# Patient Record
Sex: Female | Born: 1965 | Race: White | Hispanic: No | Marital: Married | Smoking: Never smoker
Health system: Southern US, Community
[De-identification: ages and names within clinical notes are randomized; demographics above are authoritative.]

## PROBLEM LIST (undated history)

## (undated) DIAGNOSIS — N009 Acute nephritic syndrome with unspecified morphologic changes: Secondary | ICD-10-CM

## (undated) DIAGNOSIS — K2970 Gastritis, unspecified, without bleeding: Secondary | ICD-10-CM

## (undated) DIAGNOSIS — K5900 Constipation, unspecified: Secondary | ICD-10-CM

## (undated) DIAGNOSIS — J382 Nodules of vocal cords: Secondary | ICD-10-CM

## (undated) DIAGNOSIS — C884 Extranodal marginal zone B-cell lymphoma of mucosa-associated lymphoid tissue [MALT-lymphoma]: Secondary | ICD-10-CM

## (undated) DIAGNOSIS — F419 Anxiety disorder, unspecified: Secondary | ICD-10-CM

## (undated) DIAGNOSIS — K219 Gastro-esophageal reflux disease without esophagitis: Secondary | ICD-10-CM

## (undated) DIAGNOSIS — R6882 Decreased libido: Secondary | ICD-10-CM

## (undated) DIAGNOSIS — M3500 Sicca syndrome, unspecified: Secondary | ICD-10-CM

## (undated) DIAGNOSIS — Z5189 Encounter for other specified aftercare: Secondary | ICD-10-CM

## (undated) DIAGNOSIS — T7840XA Allergy, unspecified, initial encounter: Secondary | ICD-10-CM

## (undated) DIAGNOSIS — E559 Vitamin D deficiency, unspecified: Secondary | ICD-10-CM

## (undated) DIAGNOSIS — F489 Nonpsychotic mental disorder, unspecified: Secondary | ICD-10-CM

## (undated) HISTORY — PX: RENAL BIOPSY: SHX156

## (undated) HISTORY — DX: Extranodal marginal zone B-cell lymphoma of mucosa-associated lymphoid tissue (MALT-lymphoma): C88.4

## (undated) HISTORY — DX: Decreased libido: R68.82

## (undated) HISTORY — DX: Anxiety disorder, unspecified: F41.9

## (undated) HISTORY — DX: Gastro-esophageal reflux disease without esophagitis: K21.9

## (undated) HISTORY — DX: Nonpsychotic mental disorder, unspecified: F48.9

## (undated) HISTORY — DX: Encounter for other specified aftercare: Z51.89

## (undated) HISTORY — PX: ORIF METATARSAL FRACTURE: SUR942

## (undated) HISTORY — PX: BREAST CYST ASPIRATION: SHX578

## (undated) HISTORY — DX: Sjogren syndrome, unspecified: M35.00

## (undated) HISTORY — PX: TONSILLECTOMY: SUR1361

## (undated) HISTORY — DX: Allergy, unspecified, initial encounter: T78.40XA

## (undated) HISTORY — PX: PAROTIDECTOMY: SUR1003

## (undated) HISTORY — DX: Nodules of vocal cords: J38.2

## (undated) HISTORY — DX: Vitamin D deficiency, unspecified: E55.9

## (undated) HISTORY — PX: LUMBAR FUSION: SHX111

## (undated) HISTORY — DX: Gastritis, unspecified, without bleeding: K29.70

## (undated) HISTORY — DX: Constipation, unspecified: K59.00

## (undated) HISTORY — DX: Acute nephritic syndrome with unspecified morphologic changes: N00.9

---

## 2017-03-20 DIAGNOSIS — U071 COVID-19: Secondary | ICD-10-CM

## 2017-03-20 HISTORY — DX: COVID-19: U07.1

## 2019-11-20 DIAGNOSIS — M3500 Sicca syndrome, unspecified: Secondary | ICD-10-CM | POA: Diagnosis not present

## 2019-11-20 DIAGNOSIS — G4700 Insomnia, unspecified: Secondary | ICD-10-CM | POA: Diagnosis not present

## 2019-11-20 DIAGNOSIS — F411 Generalized anxiety disorder: Secondary | ICD-10-CM | POA: Diagnosis not present

## 2019-11-20 DIAGNOSIS — N059 Unspecified nephritic syndrome with unspecified morphologic changes: Secondary | ICD-10-CM | POA: Diagnosis not present

## 2019-11-21 ENCOUNTER — Other Ambulatory Visit: Payer: Self-pay | Admitting: Family Medicine

## 2019-11-21 DIAGNOSIS — N6322 Unspecified lump in the left breast, upper inner quadrant: Secondary | ICD-10-CM

## 2019-12-25 DIAGNOSIS — G4700 Insomnia, unspecified: Secondary | ICD-10-CM | POA: Diagnosis not present

## 2019-12-25 DIAGNOSIS — B9689 Other specified bacterial agents as the cause of diseases classified elsewhere: Secondary | ICD-10-CM | POA: Diagnosis not present

## 2019-12-25 DIAGNOSIS — H109 Unspecified conjunctivitis: Secondary | ICD-10-CM | POA: Diagnosis not present

## 2019-12-26 DIAGNOSIS — R922 Inconclusive mammogram: Secondary | ICD-10-CM | POA: Diagnosis not present

## 2019-12-26 DIAGNOSIS — N6489 Other specified disorders of breast: Secondary | ICD-10-CM | POA: Diagnosis not present

## 2020-01-21 DIAGNOSIS — R319 Hematuria, unspecified: Secondary | ICD-10-CM | POA: Diagnosis not present

## 2020-01-21 DIAGNOSIS — Z79890 Hormone replacement therapy (postmenopausal): Secondary | ICD-10-CM | POA: Diagnosis not present

## 2020-01-21 DIAGNOSIS — Z23 Encounter for immunization: Secondary | ICD-10-CM | POA: Diagnosis not present

## 2020-01-21 DIAGNOSIS — E781 Pure hyperglyceridemia: Secondary | ICD-10-CM | POA: Diagnosis not present

## 2020-01-21 DIAGNOSIS — N059 Unspecified nephritic syndrome with unspecified morphologic changes: Secondary | ICD-10-CM | POA: Diagnosis not present

## 2020-01-21 DIAGNOSIS — M3500 Sicca syndrome, unspecified: Secondary | ICD-10-CM | POA: Diagnosis not present

## 2020-02-02 DIAGNOSIS — C8510 Unspecified B-cell lymphoma, unspecified site: Secondary | ICD-10-CM | POA: Diagnosis not present

## 2020-02-02 DIAGNOSIS — M3500 Sicca syndrome, unspecified: Secondary | ICD-10-CM | POA: Diagnosis not present

## 2020-02-02 DIAGNOSIS — R7309 Other abnormal glucose: Secondary | ICD-10-CM | POA: Diagnosis not present

## 2020-02-02 DIAGNOSIS — N059 Unspecified nephritic syndrome with unspecified morphologic changes: Secondary | ICD-10-CM | POA: Diagnosis not present

## 2020-02-02 DIAGNOSIS — R809 Proteinuria, unspecified: Secondary | ICD-10-CM | POA: Diagnosis not present

## 2020-02-03 DIAGNOSIS — S61031A Puncture wound without foreign body of right thumb without damage to nail, initial encounter: Secondary | ICD-10-CM | POA: Diagnosis not present

## 2020-02-08 DIAGNOSIS — L03011 Cellulitis of right finger: Secondary | ICD-10-CM | POA: Diagnosis not present

## 2020-02-10 DIAGNOSIS — L0390 Cellulitis, unspecified: Secondary | ICD-10-CM | POA: Diagnosis not present

## 2020-02-17 DIAGNOSIS — M3500 Sicca syndrome, unspecified: Secondary | ICD-10-CM | POA: Diagnosis not present

## 2020-02-17 DIAGNOSIS — N059 Unspecified nephritic syndrome with unspecified morphologic changes: Secondary | ICD-10-CM | POA: Diagnosis not present

## 2020-02-18 DIAGNOSIS — R809 Proteinuria, unspecified: Secondary | ICD-10-CM | POA: Diagnosis not present

## 2020-02-18 DIAGNOSIS — N1831 Chronic kidney disease, stage 3a: Secondary | ICD-10-CM | POA: Diagnosis not present

## 2020-02-19 DIAGNOSIS — L03011 Cellulitis of right finger: Secondary | ICD-10-CM | POA: Diagnosis not present

## 2020-03-02 DIAGNOSIS — Z981 Arthrodesis status: Secondary | ICD-10-CM | POA: Diagnosis not present

## 2020-03-02 DIAGNOSIS — M47816 Spondylosis without myelopathy or radiculopathy, lumbar region: Secondary | ICD-10-CM | POA: Diagnosis not present

## 2020-03-02 DIAGNOSIS — R937 Abnormal findings on diagnostic imaging of other parts of musculoskeletal system: Secondary | ICD-10-CM | POA: Diagnosis not present

## 2020-03-02 DIAGNOSIS — M48061 Spinal stenosis, lumbar region without neurogenic claudication: Secondary | ICD-10-CM | POA: Diagnosis not present

## 2020-03-09 DIAGNOSIS — M3500 Sicca syndrome, unspecified: Secondary | ICD-10-CM | POA: Diagnosis not present

## 2020-03-16 DIAGNOSIS — Z20828 Contact with and (suspected) exposure to other viral communicable diseases: Secondary | ICD-10-CM | POA: Diagnosis not present

## 2020-03-18 DIAGNOSIS — Z20822 Contact with and (suspected) exposure to covid-19: Secondary | ICD-10-CM | POA: Diagnosis not present

## 2020-03-23 DIAGNOSIS — F439 Reaction to severe stress, unspecified: Secondary | ICD-10-CM | POA: Diagnosis not present

## 2020-03-23 DIAGNOSIS — F419 Anxiety disorder, unspecified: Secondary | ICD-10-CM | POA: Diagnosis not present

## 2020-03-23 DIAGNOSIS — G44209 Tension-type headache, unspecified, not intractable: Secondary | ICD-10-CM | POA: Diagnosis not present

## 2020-03-24 DIAGNOSIS — F4322 Adjustment disorder with anxiety: Secondary | ICD-10-CM | POA: Diagnosis not present

## 2020-03-26 DIAGNOSIS — Z1152 Encounter for screening for COVID-19: Secondary | ICD-10-CM | POA: Diagnosis not present

## 2020-04-08 DIAGNOSIS — R809 Proteinuria, unspecified: Secondary | ICD-10-CM | POA: Diagnosis not present

## 2020-04-13 DIAGNOSIS — F4322 Adjustment disorder with anxiety: Secondary | ICD-10-CM | POA: Diagnosis not present

## 2020-04-16 DIAGNOSIS — R809 Proteinuria, unspecified: Secondary | ICD-10-CM | POA: Diagnosis not present

## 2020-04-16 DIAGNOSIS — D472 Monoclonal gammopathy: Secondary | ICD-10-CM | POA: Diagnosis not present

## 2020-04-16 DIAGNOSIS — M3500 Sicca syndrome, unspecified: Secondary | ICD-10-CM | POA: Diagnosis not present

## 2020-04-16 DIAGNOSIS — N055 Unspecified nephritic syndrome with diffuse mesangiocapillary glomerulonephritis: Secondary | ICD-10-CM | POA: Diagnosis not present

## 2020-04-22 DIAGNOSIS — F4322 Adjustment disorder with anxiety: Secondary | ICD-10-CM | POA: Diagnosis not present

## 2020-05-04 DIAGNOSIS — F4322 Adjustment disorder with anxiety: Secondary | ICD-10-CM | POA: Diagnosis not present

## 2020-05-10 DIAGNOSIS — F4322 Adjustment disorder with anxiety: Secondary | ICD-10-CM | POA: Diagnosis not present

## 2020-05-18 DIAGNOSIS — R768 Other specified abnormal immunological findings in serum: Secondary | ICD-10-CM | POA: Diagnosis not present

## 2020-05-18 DIAGNOSIS — N059 Unspecified nephritic syndrome with unspecified morphologic changes: Secondary | ICD-10-CM | POA: Diagnosis not present

## 2020-05-18 DIAGNOSIS — M3500 Sicca syndrome, unspecified: Secondary | ICD-10-CM | POA: Diagnosis not present

## 2020-05-18 DIAGNOSIS — M2550 Pain in unspecified joint: Secondary | ICD-10-CM | POA: Diagnosis not present

## 2020-05-20 DIAGNOSIS — M9901 Segmental and somatic dysfunction of cervical region: Secondary | ICD-10-CM | POA: Diagnosis not present

## 2020-05-20 DIAGNOSIS — M9903 Segmental and somatic dysfunction of lumbar region: Secondary | ICD-10-CM | POA: Diagnosis not present

## 2020-05-20 DIAGNOSIS — M9902 Segmental and somatic dysfunction of thoracic region: Secondary | ICD-10-CM | POA: Diagnosis not present

## 2020-05-20 DIAGNOSIS — M9905 Segmental and somatic dysfunction of pelvic region: Secondary | ICD-10-CM | POA: Diagnosis not present

## 2020-05-28 DIAGNOSIS — M9901 Segmental and somatic dysfunction of cervical region: Secondary | ICD-10-CM | POA: Diagnosis not present

## 2020-05-28 DIAGNOSIS — M9902 Segmental and somatic dysfunction of thoracic region: Secondary | ICD-10-CM | POA: Diagnosis not present

## 2020-05-28 DIAGNOSIS — M9905 Segmental and somatic dysfunction of pelvic region: Secondary | ICD-10-CM | POA: Diagnosis not present

## 2020-05-28 DIAGNOSIS — M9903 Segmental and somatic dysfunction of lumbar region: Secondary | ICD-10-CM | POA: Diagnosis not present

## 2020-06-18 DIAGNOSIS — M9901 Segmental and somatic dysfunction of cervical region: Secondary | ICD-10-CM | POA: Diagnosis not present

## 2020-06-18 DIAGNOSIS — M9903 Segmental and somatic dysfunction of lumbar region: Secondary | ICD-10-CM | POA: Diagnosis not present

## 2020-06-18 DIAGNOSIS — M9902 Segmental and somatic dysfunction of thoracic region: Secondary | ICD-10-CM | POA: Diagnosis not present

## 2020-06-18 DIAGNOSIS — M9905 Segmental and somatic dysfunction of pelvic region: Secondary | ICD-10-CM | POA: Diagnosis not present

## 2020-06-25 DIAGNOSIS — M9903 Segmental and somatic dysfunction of lumbar region: Secondary | ICD-10-CM | POA: Diagnosis not present

## 2020-06-25 DIAGNOSIS — M9901 Segmental and somatic dysfunction of cervical region: Secondary | ICD-10-CM | POA: Diagnosis not present

## 2020-06-25 DIAGNOSIS — M9905 Segmental and somatic dysfunction of pelvic region: Secondary | ICD-10-CM | POA: Diagnosis not present

## 2020-06-25 DIAGNOSIS — M9902 Segmental and somatic dysfunction of thoracic region: Secondary | ICD-10-CM | POA: Diagnosis not present

## 2020-07-19 DIAGNOSIS — Z79890 Hormone replacement therapy (postmenopausal): Secondary | ICD-10-CM | POA: Diagnosis not present

## 2020-07-21 DIAGNOSIS — R809 Proteinuria, unspecified: Secondary | ICD-10-CM | POA: Diagnosis not present

## 2020-07-26 DIAGNOSIS — N055 Unspecified nephritic syndrome with diffuse mesangiocapillary glomerulonephritis: Secondary | ICD-10-CM | POA: Diagnosis not present

## 2020-07-26 DIAGNOSIS — D472 Monoclonal gammopathy: Secondary | ICD-10-CM | POA: Diagnosis not present

## 2020-07-26 DIAGNOSIS — R809 Proteinuria, unspecified: Secondary | ICD-10-CM | POA: Diagnosis not present

## 2020-07-26 DIAGNOSIS — D84821 Immunodeficiency due to drugs: Secondary | ICD-10-CM | POA: Diagnosis not present

## 2020-08-02 ENCOUNTER — Telehealth: Payer: Self-pay | Admitting: Adult Health

## 2020-08-02 DIAGNOSIS — N059 Unspecified nephritic syndrome with unspecified morphologic changes: Secondary | ICD-10-CM | POA: Diagnosis not present

## 2020-08-02 DIAGNOSIS — M3500 Sicca syndrome, unspecified: Secondary | ICD-10-CM | POA: Diagnosis not present

## 2020-08-02 DIAGNOSIS — M2550 Pain in unspecified joint: Secondary | ICD-10-CM | POA: Diagnosis not present

## 2020-08-02 DIAGNOSIS — R768 Other specified abnormal immunological findings in serum: Secondary | ICD-10-CM | POA: Diagnosis not present

## 2020-08-02 NOTE — Telephone Encounter (Signed)
I called patient to discuss Evusheld, a long acting monoclonal antibody injection administered to patients with decreased immune systems or intolerance/allergy to the COVID 19 vaccine as COVID19 prevention.    Patient is going to f/u with Dr. Amil Amen and her insurance and will call me back if she decides to proceed with the injection.  Call back # given.   Wilber Bihari, NP

## 2020-08-10 DIAGNOSIS — D225 Melanocytic nevi of trunk: Secondary | ICD-10-CM | POA: Diagnosis not present

## 2020-08-10 DIAGNOSIS — D224 Melanocytic nevi of scalp and neck: Secondary | ICD-10-CM | POA: Diagnosis not present

## 2020-08-10 DIAGNOSIS — L821 Other seborrheic keratosis: Secondary | ICD-10-CM | POA: Diagnosis not present

## 2020-08-10 DIAGNOSIS — L814 Other melanin hyperpigmentation: Secondary | ICD-10-CM | POA: Diagnosis not present

## 2020-08-10 DIAGNOSIS — D485 Neoplasm of uncertain behavior of skin: Secondary | ICD-10-CM | POA: Diagnosis not present

## 2020-08-12 ENCOUNTER — Ambulatory Visit (INDEPENDENT_AMBULATORY_CARE_PROVIDER_SITE_OTHER): Payer: BC Managed Care – PPO

## 2020-08-12 ENCOUNTER — Other Ambulatory Visit: Payer: Self-pay

## 2020-08-12 ENCOUNTER — Other Ambulatory Visit: Payer: Self-pay | Admitting: Adult Health

## 2020-08-12 VITALS — BP 129/78 | HR 71 | Temp 97.9°F | Resp 16

## 2020-08-12 DIAGNOSIS — D849 Immunodeficiency, unspecified: Secondary | ICD-10-CM

## 2020-08-12 DIAGNOSIS — Z298 Encounter for other specified prophylactic measures: Secondary | ICD-10-CM

## 2020-08-12 MED ORDER — TIXAGEVIMAB (PART OF EVUSHELD) INJECTION
300.0000 mg | Freq: Once | INTRAMUSCULAR | Status: AC
  Administered 2020-08-12: 300 mg via INTRAMUSCULAR

## 2020-08-12 MED ORDER — ALBUTEROL SULFATE HFA 108 (90 BASE) MCG/ACT IN AERS: 2.0000 | INHALATION_SPRAY | Freq: Once | RESPIRATORY_TRACT | Status: DC | PRN

## 2020-08-12 MED ORDER — EPINEPHRINE 0.3 MG/0.3ML IJ SOAJ: 0.3000 mg | Freq: Once | INTRAMUSCULAR | Status: DC | PRN

## 2020-08-12 MED ORDER — METHYLPREDNISOLONE SODIUM SUCC 125 MG IJ SOLR: 125.0000 mg | Freq: Once | INTRAMUSCULAR | Status: AC | PRN

## 2020-08-12 MED ORDER — DIPHENHYDRAMINE HCL 50 MG/ML IJ SOLN: 50.0000 mg | Freq: Once | INTRAMUSCULAR | Status: DC | PRN

## 2020-08-12 MED ORDER — CILGAVIMAB (PART OF EVUSHELD) INJECTION
300.0000 mg | Freq: Once | INTRAMUSCULAR | Status: AC
Start: 2020-08-12 — End: 2020-08-12
  Administered 2020-08-12: 300 mg via INTRAMUSCULAR
  Filled 2020-08-12: qty 3

## 2020-08-12 NOTE — Progress Notes (Signed)
I connected by phone with Mindy Fuller on 08/12/2020, 11:13 AM to discuss the potential use of a new treatment, tixagevimab/cilgavimab, for pre-exposure prophylaxis for prevention of coronavirus disease 2019 (COVID-19) caused by the SARS-CoV-2 virus.  This patient is a 55 y.o. female that meets the FDA criteria for Emergency Use Authorization of tixagevimab/cilgavimab for pre-exposure prophylaxis of COVID-19 disease. Pt meets following criteria:  Age >12 yr and weight > 40kg  Not currently infected with SARS-CoV-2 and has no known recent exposure to an individual infected with SARS-CoV-2 AND o Who has moderate to severe immune compromise due to a medical condition or receipt of immunosuppressive medications or treatments and may not mount an adequate immune response to COVID-19 vaccination or  o Vaccination with any available COVID-19 vaccine, according to the approved or authorized schedule, is not recommended due to a history of severe adverse reaction (e.g., severe allergic reaction) to a COVID-19 vaccine(s) and/or COVID-19 vaccine component(s).  o Patient meets the following definition of mod-severe immune compromised status: 1. Received B-cell depleting therapies (e.g. rituximab, obinutuzumab, ocrelizumab, alemtuzumab) within last 6 months & age > or = 54  I have spoken and communicated the following to the patient or parent/caregiver regarding COVID monoclonal antibody treatment:  1. FDA has authorized the emergency use of tixagevimab/cilgavimab for the pre-exposure prophylaxis of COVID-19 in patients with moderate-severe immunocompromised status, who meet above EUA criteria.  2. The significant known and potential risks and benefits of COVID monoclonal antibody, and the extent to which such potential risks and benefits are unknown.  3. Information on available alternative treatments and the risks and benefits of those alternatives, including clinical trials.  4. The patient or  parent/caregiver has the option to accept or refuse COVID monoclonal antibody treatment.  After reviewing this information with the patient, agree to receive tixagevimab/cilgavimab.    Scot Dock, NP, 08/12/2020, 11:13 AM

## 2020-08-12 NOTE — Progress Notes (Signed)
Diagnosis: Pre-COVID Exposure Prophylaxis  Provider:  Marshell Garfinkel, MD  Procedure: Injection  Evusheld, Dose: 600mg , Site: intramuscular  Discharge: Condition: Good, Destination: Home . AVS provided to patient.   Performed by:  Paul Dykes, RN

## 2020-08-13 DIAGNOSIS — M9905 Segmental and somatic dysfunction of pelvic region: Secondary | ICD-10-CM | POA: Diagnosis not present

## 2020-08-13 DIAGNOSIS — M9901 Segmental and somatic dysfunction of cervical region: Secondary | ICD-10-CM | POA: Diagnosis not present

## 2020-08-13 DIAGNOSIS — M9902 Segmental and somatic dysfunction of thoracic region: Secondary | ICD-10-CM | POA: Diagnosis not present

## 2020-08-13 DIAGNOSIS — M9903 Segmental and somatic dysfunction of lumbar region: Secondary | ICD-10-CM | POA: Diagnosis not present

## 2020-08-17 DIAGNOSIS — M9901 Segmental and somatic dysfunction of cervical region: Secondary | ICD-10-CM | POA: Diagnosis not present

## 2020-08-17 DIAGNOSIS — M9903 Segmental and somatic dysfunction of lumbar region: Secondary | ICD-10-CM | POA: Diagnosis not present

## 2020-08-17 DIAGNOSIS — M9905 Segmental and somatic dysfunction of pelvic region: Secondary | ICD-10-CM | POA: Diagnosis not present

## 2020-08-17 DIAGNOSIS — M9902 Segmental and somatic dysfunction of thoracic region: Secondary | ICD-10-CM | POA: Diagnosis not present

## 2020-09-22 DIAGNOSIS — M3500 Sicca syndrome, unspecified: Secondary | ICD-10-CM | POA: Diagnosis not present

## 2020-09-28 DIAGNOSIS — M9905 Segmental and somatic dysfunction of pelvic region: Secondary | ICD-10-CM | POA: Diagnosis not present

## 2020-09-28 DIAGNOSIS — M9902 Segmental and somatic dysfunction of thoracic region: Secondary | ICD-10-CM | POA: Diagnosis not present

## 2020-09-28 DIAGNOSIS — M62838 Other muscle spasm: Secondary | ICD-10-CM | POA: Diagnosis not present

## 2020-09-28 DIAGNOSIS — M9903 Segmental and somatic dysfunction of lumbar region: Secondary | ICD-10-CM | POA: Diagnosis not present

## 2020-09-28 DIAGNOSIS — M9901 Segmental and somatic dysfunction of cervical region: Secondary | ICD-10-CM | POA: Diagnosis not present

## 2020-10-13 ENCOUNTER — Encounter: Payer: Self-pay | Admitting: Gastroenterology

## 2020-10-14 ENCOUNTER — Telehealth: Payer: Self-pay | Admitting: Hematology

## 2020-10-14 NOTE — Telephone Encounter (Signed)
Received a new pt referral from Dr. Virgina Jock to establish care with an oncologist. Mindy Fuller has been cld and scheduled to see Dr. Irene Limbo on 8/4 at 11am. Pt aware to arrive 20 minutes early.

## 2020-10-19 ENCOUNTER — Encounter: Payer: Self-pay | Admitting: Internal Medicine

## 2020-10-19 DIAGNOSIS — U071 COVID-19: Secondary | ICD-10-CM | POA: Diagnosis not present

## 2020-10-25 ENCOUNTER — Telehealth: Payer: Self-pay | Admitting: Hematology

## 2020-10-25 NOTE — Telephone Encounter (Signed)
Mindy Fuller has been reschedule to see Dr. Irene Limbo on 8/29 at 11am.

## 2020-10-28 ENCOUNTER — Ambulatory Visit: Payer: BC Managed Care – PPO | Admitting: Hematology

## 2020-11-12 DIAGNOSIS — M9902 Segmental and somatic dysfunction of thoracic region: Secondary | ICD-10-CM | POA: Diagnosis not present

## 2020-11-12 DIAGNOSIS — M9905 Segmental and somatic dysfunction of pelvic region: Secondary | ICD-10-CM | POA: Diagnosis not present

## 2020-11-12 DIAGNOSIS — M9903 Segmental and somatic dysfunction of lumbar region: Secondary | ICD-10-CM | POA: Diagnosis not present

## 2020-11-12 DIAGNOSIS — M9901 Segmental and somatic dysfunction of cervical region: Secondary | ICD-10-CM | POA: Diagnosis not present

## 2020-11-12 DIAGNOSIS — M62838 Other muscle spasm: Secondary | ICD-10-CM | POA: Diagnosis not present

## 2020-11-15 ENCOUNTER — Inpatient Hospital Stay: Payer: BC Managed Care – PPO | Attending: Hematology | Admitting: Hematology

## 2020-11-15 ENCOUNTER — Other Ambulatory Visit: Payer: Self-pay

## 2020-11-15 VITALS — BP 112/65 | HR 75 | Temp 97.9°F | Resp 16 | Ht 66.0 in | Wt 127.3 lb

## 2020-11-15 DIAGNOSIS — N049 Nephrotic syndrome with unspecified morphologic changes: Secondary | ICD-10-CM | POA: Diagnosis not present

## 2020-11-15 DIAGNOSIS — Z881 Allergy status to other antibiotic agents status: Secondary | ICD-10-CM | POA: Insufficient documentation

## 2020-11-15 DIAGNOSIS — Z885 Allergy status to narcotic agent status: Secondary | ICD-10-CM | POA: Insufficient documentation

## 2020-11-15 DIAGNOSIS — Z8572 Personal history of non-Hodgkin lymphomas: Secondary | ICD-10-CM | POA: Insufficient documentation

## 2020-11-15 DIAGNOSIS — Z8249 Family history of ischemic heart disease and other diseases of the circulatory system: Secondary | ICD-10-CM | POA: Insufficient documentation

## 2020-11-15 DIAGNOSIS — Z7952 Long term (current) use of systemic steroids: Secondary | ICD-10-CM | POA: Diagnosis not present

## 2020-11-15 DIAGNOSIS — Z8616 Personal history of COVID-19: Secondary | ICD-10-CM | POA: Insufficient documentation

## 2020-11-15 DIAGNOSIS — Z880 Allergy status to penicillin: Secondary | ICD-10-CM | POA: Diagnosis not present

## 2020-11-15 DIAGNOSIS — M3500 Sicca syndrome, unspecified: Secondary | ICD-10-CM | POA: Diagnosis not present

## 2020-11-15 DIAGNOSIS — Z79899 Other long term (current) drug therapy: Secondary | ICD-10-CM | POA: Diagnosis not present

## 2020-11-15 DIAGNOSIS — Z833 Family history of diabetes mellitus: Secondary | ICD-10-CM | POA: Diagnosis not present

## 2020-11-15 DIAGNOSIS — Z8719 Personal history of other diseases of the digestive system: Secondary | ICD-10-CM | POA: Insufficient documentation

## 2020-11-15 DIAGNOSIS — C8580 Other specified types of non-Hodgkin lymphoma, unspecified site: Secondary | ICD-10-CM

## 2020-11-16 ENCOUNTER — Telehealth: Payer: Self-pay | Admitting: Hematology

## 2020-11-16 ENCOUNTER — Other Ambulatory Visit: Payer: Self-pay

## 2020-11-16 DIAGNOSIS — C8580 Other specified types of non-Hodgkin lymphoma, unspecified site: Secondary | ICD-10-CM

## 2020-11-16 NOTE — Telephone Encounter (Signed)
Attempted to schedule follow-up appointment per 8/29 los. Patient stated she would call back in 6 months to schedule.

## 2020-11-17 DIAGNOSIS — N055 Unspecified nephritic syndrome with diffuse mesangiocapillary glomerulonephritis: Secondary | ICD-10-CM | POA: Diagnosis not present

## 2020-11-22 NOTE — Progress Notes (Signed)
[facility]   HEMATOLOGY/ONCOLOGY CONSULTATION NOTE  Date of Service: .11/15/2020   Patient Care Team: Shon Baton, MD as PCP - General (Internal Medicine)  CHIEF COMPLAINTS/PURPOSE OF CONSULTATION:  History of parotid extranodal marginal zone lymphoma  HISTORY OF PRESENTING ILLNESS:   Mindy Fuller is a wonderful 55 y.o. 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 Patient has a history of Sjogren's syndrome diagnosed per lip biopsy about 14 years ago.  She has had chronic sicca syndrome.  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  This has been treated with Rituxan and she continues to be on maintenance Rituxan every 6 months as per her nephrologist.  Patient notes her extranodal marginal zone lymphoma had presented as a lump in her right parotid gland resulting in right superficial parotidectomy with complete resection of this lesion.  She notes previous imaging have not shown any other recurrence of her low-grade non-Hodgkin's lymphoma. This surgical resection was about 4 to 5 years ago.. We discussed that her maintenance Rituxan from her post COVID glomerulonephritis is likely suppressing her lymphoma with regards to recurrence risk as well.  She notes that she is establishing rheumatology follow-up in [place] as well.  Moved from [place] to [place] in June 2021.  Currently notes she has been feeling well with no fevers no chills no night sweats no unexpected weight loss.  No new lumps or bumps.  Acute new shortness of breath or chest pain.  No pedal edema.  No abdominal pain or distention.     MEDICAL HISTORY:  Past Medical History:  Diagnosis Date   Acute glomerulonephritis    s/p COVID 2019    Anxiety    Constipation    COVID 2019   Extranodal marginal zone B-cell lymphoma (HCC)    Gastritis    GERD (gastroesophageal reflux disease)    Low libido    Sjogren's syndrome (HCC)    Tension    Vitamin D deficiency    Vocal cord nodules   Follows with Dr. Renette Butters for her glomerulonephritis   SURGICAL HISTORY Right superficial parotidectomy for extranodal marginal zone lymphoma Kidney biopsy 03/2019 History of breast biopsy benign cyst aspirated   SOCIAL HISTORY: Social History   Socioeconomic History   Marital status: Married    Spouse name: Not on file   Number of children: Not on file   Years of education: Not on file   Highest education level: Not on file  Occupational History   Not on file  Tobacco Use   Smoking status: Never    Passive exposure: Never   Smokeless tobacco: Never  Vaping Use   Vaping Use: Never used  Substance and Sexual Activity   Alcohol use: Yes    Comment: rare   Drug use: Never   Sexual activity: Not on file  Other Topics Concern   Not on file  Social History Narrative   Not on file   Social Determinants of Health   Financial Resource Strain: Not on file  Food Insecurity: Not on file  Transportation Needs: Not on file  Physical Activity: Not on file  Stress: Not on file  Social Connections: Not on file  Intimate Partner Violence: Not on file  Non-smoker nondrinker. 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   FAMILY HISTORY: Family History  Problem Relation Age of Onset   Hypertension Mother    Coronary artery disease Father    Diabetes Mellitus I Father     ALLERGIES:  is allergic to clarithromycin, amoxicillin, ciprofloxacin, clavulanic acid, and hydrocodone-acetaminophen.  MEDICATIONS:  Current Outpatient Medications  Medication Sig Dispense Refill   acetaminophen (TYLENOL) 500 MG tablet Take 1 tablet by mouth as needed.     ALPRAZolam (XANAX) 0.25 MG tablet Take 0.25 mg by mouth 2 (two) times  daily.     Cholecalciferol (VITAMIN D) 50 MCG (2000 UT) tablet Take 1 tablet by mouth daily.     esomeprazole (NEXIUM) 40 MG capsule Take 20 mg by mouth daily.     Lactobacillus Rhamnosus, GG, (RA PROBIOTIC DIGESTIVE CARE) CAPS Take 1 capsule by mouth daily.     losartan (COZAAR) 25 MG tablet Take 25 mg by mouth daily.     predniSONE (DELTASONE) 5 MG tablet Take 2.5 mg by mouth daily.     progesterone (PROMETRIUM) 100 MG capsule Take 100 mg by mouth daily as needed.     Zinc 50 MG TABS Take 50 mg by mouth daily.     alendronate (FOSAMAX) 70 MG tablet Take 70 mg by mouth once a week. (Patient not taking: Reported on 11/15/2020)     busPIRone (BUSPAR) 10 MG tablet Take 10 mg by mouth every evening.     polyethylene glycol (MIRALAX / GLYCOLAX) 17 g packet Take 17 g by mouth as needed.     riTUXimab (RITUXAN) 100 MG/10ML injection See admin instructions. As directed     traZODone (DESYREL) 100 MG tablet Take 1 tablet by mouth at bedtime.     Current Facility-Administered Medications  Medication Dose Route Frequency Provider Last Rate Last Admin   albuterol (VENTOLIN HFA) 108 (90 Base) MCG/ACT inhaler 2 puff  2 puff Inhalation Once PRN Causey, Charlestine Massed, NP       diphenhydrAMINE (BENADRYL) injection 50 mg  50 mg Intramuscular Once PRN Causey, Charlestine Massed, NP       EPINEPHrine (EPI-PEN) injection 0.3 mg  0.3 mg Intramuscular Once PRN Causey, Charlestine Massed, NP       methylPREDNISolone sodium succinate (SOLU-MEDROL) 125 mg/2 mL injection 125 mg  125 mg Intramuscular Once PRN Causey, Charlestine Massed, NP        REVIEW OF SYSTEMS:    10 Point review of Systems was done is negative except as noted above.  PHYSICAL EXAMINATION: ECOG PERFORMANCE STATUS: 1 - Symptomatic but completely ambulatory  . Vitals:   11/15/20 1113  BP: 112/65  Pulse: 75  Resp: 16  Temp: 97.9 F (36.6 C)  SpO2: 100%   Filed Weights   11/15/20 1113  Weight: 127 lb 4.8 oz (57.7 kg)   .Body mass index  is 20.55 kg/m.  GENERAL:alert, in no acute distress and comfortable SKIN: no acute rashes, no significant lesions EYES: conjunctiva are pink and non-injected, sclera anicteric OROPHARYNX: MMM, no exudates, no oropharyngeal erythema or ulceration NECK: supple, no JVD LYMPH:  no palpable lymphadenopathy in the cervical, axillary or inguinal regions LUNGS: clear to auscultation b/l with normal respiratory effort HEART: regular rate & rhythm ABDOMEN:  normoactive bowel sounds , non tender, not distended. Extremity: no pedal edema PSYCH: alert & oriented x 3 with fluent speech NEURO: no focal motor/sensory deficits  LABORATORY DATA:  I have reviewed the data as listed  .No flowsheet data found.  .No flowsheet data  found.   RADIOGRAPHIC STUDIES: I have personally reviewed the radiological images as listed and agreed with the findings in the report. No results found.  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 Plan  -Patient currently has no clinical symptoms suggestive of recurrence of her extranodal marginal zone lymphoma. -Her parotid extranodal marginal zone lymphoma was likely related to her Sjogren's disease. -We discussed that the Rituxan she is on for post COVID glomerulonephritis treatment with her nephrologist would likely suppress to some degree recurrence of any marginal zone lymphoma at this time.. -We discussed getting labs and she would like to have these with her nephrologist would need to track CBC CMP LDH at least once a year. -Recommended staying up to speed with her COVID-19 vaccinations flu shots and other age-appropriate vaccinations with her primary care physician. -We also discussed that Sjogren's can increase the risk of other secondary cancers and she should follow-up with her primary  care physician for appropriate age-appropriate and family history appropriate cancer screening. -No indication for additional treatment of her extranodal marginal zone lymphoma at this time.   Return to clinic with Dr. Irene Limbo with labs in 12 months    All of the patients questions were answered with apparent satisfaction. The patient knows to call the clinic with any problems, questions or concerns.  I spent 40 minutes counseling the patient face to face. The total time spent in the appointment was 60 minutes and more than 50% was on counseling and direct patient cares reviewing outside records as well as discussing these and confirming these with the patient.    Sullivan Lone MD [place] AAHIVMS [facility] Hematology/Oncology Physician [facility]  (Office):       [phone] (Work cell):  [phone] (Fax):           [phone]

## 2020-11-23 ENCOUNTER — Encounter: Payer: Self-pay | Admitting: Gastroenterology

## 2020-11-23 ENCOUNTER — Ambulatory Visit (INDEPENDENT_AMBULATORY_CARE_PROVIDER_SITE_OTHER): Payer: BC Managed Care – PPO | Admitting: Gastroenterology

## 2020-11-23 VITALS — BP 100/70 | HR 72 | Ht 64.25 in | Wt 127.4 lb

## 2020-11-23 DIAGNOSIS — K219 Gastro-esophageal reflux disease without esophagitis: Secondary | ICD-10-CM

## 2020-11-23 DIAGNOSIS — M350A Sjogren syndrome with glomerular disease: Secondary | ICD-10-CM

## 2020-11-23 DIAGNOSIS — K5900 Constipation, unspecified: Secondary | ICD-10-CM | POA: Diagnosis not present

## 2020-11-23 NOTE — Progress Notes (Signed)
Referring Provider: Shon Baton, MD Primary Care Physician:  Shon Baton, MD   Reason for Consultation:  Reflux   IMPRESSION:  Reflux in the setting of Sjogren's syndrome without interest in prescription medications due to chronic risks given her personal history of osteoporosis. Has historically had an EGD every 3 years to screen for Barrett's Esophagus, although this has never been diagnosed. Does not want to take medications. Briefly discussed TIF.   Sjogren's syndrome: May be contributing to reflux.   Chronic constipation: Continue dietary management.   Colon cancer screening: Reports the last colonoscopy around 2018. Will obtain the procedure report. No personal history of polyps.   PLAN: - Consider Biotene - EGD - TIF brochure - Obtain prior colonoscopy and EGD. [facility] in [place], [place].     HPI: Mindy Fuller is a 55 y.o. female referred by Dr. Virgina Jock. The history is obtained through the patient, review of her electronic health record, and records provided by Dr. Virgina Jock. She has Srojgen's syndrome, Baboo nodule (vocal cord nodule) treated at the [facility], extranodal marginal zone B-cell lymphoma s/p prior parotidectomy 2014, tension headahces, osteoporosis, vitamin D deficiency, she is on chronic steroids for glomerulonephritis occurring after Covid. 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   Identifies first GI symptoms in 1998.  She has a history of GERD, reflux, constipation biliary colic with history of possible SOD (declined evaluation at [facility] at the time) with persistent symptoms despite cholecystectomy.  Presents now with heartburn identified as brash, eructation, and finds she has to repeatedly swallow to keep things down. No evidence for GI bleeding, iron deficiency anemia, anorexia, unexplained weight loss, dysphagia, odynophagia, persistent vomiting, or gastrointestinal cancer in a  first-degree relative.   Does not like to use PPI due to fracture risk although they have controlled her symptoms. She has been afraid to use H2B for fear of gastric cancer.  Antoine Primas provides emergency relief. Does not use Tums or Rolaids because she did not find them helpful. Avoids spicy foods.  She has been having an endoscopy every 2-3 years to look for risks. On a prior endoscopy she was diagnosed with bile acid reflux. She feels that it is time for another endoscopy due to concerns re: Barrett's Esophagus.   She is resistant to taking any medications. She has never considered RIF or fundoplication.   She is 100% organic diet over the last couple of years which has helped with her constipation. Has tried to increase the fiber in her diet at the same time.   One alcohol beverage month. No tobacco, cigarettes, vaping. Drinks 2 cups of half caf daily. No juice or sodas. Avoid spicy and processed foods. Avoids sugar. Dark sugar triggers abdominal pain.   She has chronic constipation with a bowel movement QD-QOD. Sense of complete evacuation or straining. No blood or mucous.  Failed Miralax, Trulance, Amitiza, and Linzess.   Prior colonoscopy and EGD. [facility] in [place], [place].  Last EGD [facility], [facility] The records available to me show: EGD 02/05/19 for reflux and epigastric abdominal pain: chronic inactive gastritis, LA Class A reflux esophagitis, small hiatal hernia, normal duodenal biopsies   Past Medical History:  Diagnosis Date   Acute glomerulonephritis    s/p COVID 2019   Anxiety    Constipation    COVID 2019   Extranodal marginal zone B-cell lymphoma (HCC)    Gastritis    GERD (gastroesophageal  reflux disease)    Low libido    Sjogren's syndrome (HCC)    Tension    Vitamin D deficiency    Vocal cord nodules     Past Surgical History:  Procedure Laterality Date   BREAST CYST ASPIRATION     LUMBAR FUSION     L5-S1   ORIF METATARSAL  FRACTURE Left    PAROTIDECTOMY Right    RENAL BIOPSY     TONSILLECTOMY      Prior to Admission medications   Medication Sig Start Date End Date Taking? Authorizing Provider  acetaminophen (TYLENOL) 500 MG tablet Take 1 tablet by mouth as needed. 04/20/19  Yes [provider]  ALPRAZolam (XANAX) 0.25 MG tablet Take 0.25 mg by mouth as needed. 09/26/19  Yes [provider]  busPIRone (BUSPAR) 10 MG tablet Take 10 mg by mouth every evening. 09/26/19  Yes [provider]  Cholecalciferol (VITAMIN D) 50 MCG (2000 UT) tablet Take 1 tablet by mouth daily.   Yes [provider]  esomeprazole (NEXIUM) 20 MG capsule Take 20 mg by mouth as needed.   Yes [provider]  Lactobacillus Rhamnosus, GG, (RA PROBIOTIC DIGESTIVE CARE) CAPS Take 1 capsule by mouth daily.   Yes [provider]  losartan (COZAAR) 25 MG tablet Take 25 mg by mouth daily. 05/21/19  Yes [provider]  predniSONE (DELTASONE) 5 MG tablet Take 2.5 mg by mouth daily. 07/28/19  Yes [provider]  progesterone (PROMETRIUM) 100 MG capsule Take 100 mg by mouth daily as needed. 10/13/20  Yes [provider]  riTUXimab (RITUXAN) 100 MG/10ML injection See admin instructions. Every 6 months   Yes [provider]  traZODone (DESYREL) 100 MG tablet Take 1 tablet by mouth at bedtime.   Yes [provider]  Zinc 50 MG TABS Take 50 mg by mouth daily.   Yes [provider]    Current Outpatient Medications  Medication Sig Dispense Refill   acetaminophen (TYLENOL) 500 MG tablet Take 1 tablet by mouth as needed.     ALPRAZolam (XANAX) 0.25 MG tablet Take 0.25 mg by mouth as needed.     busPIRone (BUSPAR) 10 MG tablet Take 10 mg by mouth every evening.     Cholecalciferol (VITAMIN D) 50 MCG (2000 UT) tablet Take 1 tablet by mouth daily.     esomeprazole (NEXIUM) 20 MG capsule Take 20 mg by mouth as needed.     Lactobacillus Rhamnosus, GG, (RA  PROBIOTIC DIGESTIVE CARE) CAPS Take 1 capsule by mouth daily.     losartan (COZAAR) 25 MG tablet Take 25 mg by mouth daily.     predniSONE (DELTASONE) 5 MG tablet Take 2.5 mg by mouth daily.     progesterone (PROMETRIUM) 100 MG capsule Take 100 mg by mouth daily as needed.     riTUXimab (RITUXAN) 100 MG/10ML injection See admin instructions. Every 6 months     traZODone (DESYREL) 100 MG tablet Take 1 tablet by mouth at bedtime.     Zinc 50 MG TABS Take 50 mg by mouth daily.     Current Facility-Administered Medications  Medication Dose Route Frequency Provider Last Rate Last Admin   albuterol (VENTOLIN HFA) 108 (90 Base) MCG/ACT inhaler 2 puff  2 puff Inhalation Once PRN Causey, Charlestine Massed, NP       diphenhydrAMINE (BENADRYL) injection 50 mg  50 mg Intramuscular Once PRN Causey, Charlestine Massed, NP       EPINEPHrine (EPI-PEN) injection 0.3 mg  0.3 mg Intramuscular Once PRN Gardenia Phlegm, NP       methylPREDNISolone sodium succinate (SOLU-MEDROL) 125 mg/2 mL injection 125 mg  125 mg Intramuscular Once PRN Gardenia Phlegm, NP        Allergies as of 11/23/2020 - Review Complete 11/23/2020  Allergen Reaction Noted   Clarithromycin Nausea And Vomiting and Nausea Only 01/24/2011   Amoxicillin Nausea Only 04/11/2019   Ciprofloxacin Nausea And Vomiting 10/19/2020   Hydrocodone-acetaminophen Other (See Comments) 09/28/2014    Family History  Problem Relation Age of Onset   Hypertension Mother    Other Mother        pre-diabetes   Coronary artery disease Father    Diabetes Mellitus I Father    Atrial fibrillation Father        Review of Systems: 12 system ROS is negative except as noted above with the addition of back pain and headaches.   Physical Exam: General:   Alert,  well-nourished, pleasant and cooperative in NAD Head:  Normocephalic and atraumatic. Eyes:  Sclera clear, no icterus.   Conjunctiva pink. Ears:  Normal auditory acuity. Nose:  No  deformity, discharge,  or lesions. Mouth:  No deformity or lesions.   Neck:  Supple; no masses or thyromegaly. Lungs:  Clear throughout to auscultation.   No wheezes. Heart:  Regular rate and rhythm; no murmurs. Abdomen:  Soft, nontender, nondistended, normal bowel sounds, no rebound or guarding. No hepatosplenomegaly.   Rectal:  Deferred  Msk:  Symmetrical. No boney deformities LAD: No inguinal or umbilical LAD Extremities:  No clubbing or edema. Neurologic:  Alert and  oriented x4;  grossly nonfocal Skin:  Intact without significant lesions or rashes. Psych:  Alert and cooperative. Normal mood and affect.     Winnell Bento L. Tarri Glenn, MD, MPH 11/28/2020, 9:03 PM

## 2020-11-23 NOTE — Patient Instructions (Addendum)
It was my pleasure to provide care to you today. Based on our discussion, I am providing you with my recommendations below:  RECOMMENDATION(S):   I am providing you with a TIF procedure brochure for you to review Consider Biotene  RECORDS:  We have asked you to sign a Release of Information today to obtain records from [facility] in [place], [place] as we already have records scanned in to your chart from [facility] in [place], [place].   ENDOSCOPY:   You have been scheduled for an endoscopy. Please follow written instructions given to you at your visit today.  INHALERS:   If you use inhalers (even only as needed), please bring them with you on the day of your procedure.  FOLLOW UP:  After your procedure, you will receive a call from my office staff regarding my recommendation for follow up.  BMI:  If you are age 25 or younger, your body mass index should be between 19-25. Your Body mass index is 21.69 kg/m. If this is out of the aformentioned range listed, please consider follow up with your Primary Care Provider.   MY CHART:  The [facility] providers would like to encourage you to use [facility] to communicate with providers for non-urgent requests or questions.  Due to long hold times on the telephone, sending your provider a message by [facility] may be a faster and more efficient way to get a response.  Please allow 48 business hours for a response.  Please remember that this is for non-urgent requests.   Thank you for trusting me with your gastrointestinal care!    Thornton Park, MD, MPH

## 2020-11-25 DIAGNOSIS — M3500 Sicca syndrome, unspecified: Secondary | ICD-10-CM | POA: Diagnosis not present

## 2020-11-25 DIAGNOSIS — Z79899 Other long term (current) drug therapy: Secondary | ICD-10-CM | POA: Diagnosis not present

## 2020-11-25 DIAGNOSIS — N055 Unspecified nephritic syndrome with diffuse mesangiocapillary glomerulonephritis: Secondary | ICD-10-CM | POA: Diagnosis not present

## 2020-11-25 DIAGNOSIS — D84821 Immunodeficiency due to drugs: Secondary | ICD-10-CM | POA: Diagnosis not present

## 2020-11-25 DIAGNOSIS — Z23 Encounter for immunization: Secondary | ICD-10-CM | POA: Diagnosis not present

## 2020-11-28 ENCOUNTER — Encounter: Payer: Self-pay | Admitting: Gastroenterology

## 2020-11-29 NOTE — Progress Notes (Signed)
Records Request  Provider and/or Facility: [facility]  Document: Signed ROI  ROI has been faxed successfully to [facility]. Document(s) and fax confirmation(s) have been placed in the faxed file for future reference.

## 2020-12-07 ENCOUNTER — Telehealth: Payer: Self-pay

## 2020-12-07 NOTE — Telephone Encounter (Signed)
EGD rescheduled to Oct 12th at 8:30. Pt to arrive by 7:30. Pt reminded to follow instructions given to her prior for her EGD. Message left on pt voice mail per request.

## 2020-12-07 NOTE — Telephone Encounter (Signed)
Called pt to remind pt of upcoming appt for colonoscopy on 12/13/20.  Pt said that she needed to cancel procedure since she was coming back from [place] tomorrow and her husband has to go out-of-town.    Informed pt that I would send message to Dr. Payton Emerald nurse to cancel appointment and someone would call her back to reschedule the procedure.

## 2020-12-07 NOTE — Progress Notes (Signed)
SECOND REQUEST:  Records Request   Provider and/or Facility: [facility]  Document: Signed ROI   ROI has been faxed successfully to [facility]. Document(s) and fax confirmation(s) have been placed in the faxed file for future reference.

## 2020-12-08 ENCOUNTER — Telehealth: Payer: Self-pay

## 2020-12-08 ENCOUNTER — Other Ambulatory Visit: Payer: Self-pay

## 2020-12-08 DIAGNOSIS — K219 Gastro-esophageal reflux disease without esophagitis: Secondary | ICD-10-CM

## 2020-12-08 NOTE — Telephone Encounter (Signed)
Ambulatory Referral To GI entered

## 2020-12-08 NOTE — Telephone Encounter (Signed)
-----   Message from Darden Dates sent at 12/08/2020  7:27 AM EDT ----- Regarding: RE: EGD Changed Hi [name], Thanks for the heads up! I'm glad you have messaged me because there was no ambulatory referral put in when the original appt was booked.  Would you please pt in an amb referral for the EGD and I will link it with the appt? When you put in an ambulatory referral, it drops into my que so that I can check for precert requirements.  Otherwise, I will never know the patient is booked for anything. I appreciate it! Thanks, Amy ----- Message ----- From: Gillermina Hu, RN Sent: 12/07/2020   3:46 PM EDT To: Darden Dates Subject: EGD Changed                                    Just FYI this pt has been rescheduled for an EGD

## 2020-12-09 ENCOUNTER — Telehealth: Payer: Self-pay

## 2020-12-09 NOTE — Telephone Encounter (Signed)
Pt called and wanted to confirm the date and time of her procedure. Pt date and time confirmed. Pt verbalized understanding.

## 2020-12-13 ENCOUNTER — Encounter: Payer: BC Managed Care – PPO | Admitting: Gastroenterology

## 2020-12-15 NOTE — Progress Notes (Signed)
Records Request - FINAL ATTEMPT   Provider and/or Facility: Deveron Furlong  Document: Signed ROI   ROI has been faxed successfully to [facility]. Document(s) and fax confirmation(s) have been placed in the faxed file for future reference.

## 2020-12-20 ENCOUNTER — Telehealth: Payer: Self-pay | Admitting: Gastroenterology

## 2020-12-20 DIAGNOSIS — M9905 Segmental and somatic dysfunction of pelvic region: Secondary | ICD-10-CM | POA: Diagnosis not present

## 2020-12-20 DIAGNOSIS — M9903 Segmental and somatic dysfunction of lumbar region: Secondary | ICD-10-CM | POA: Diagnosis not present

## 2020-12-20 DIAGNOSIS — M62838 Other muscle spasm: Secondary | ICD-10-CM | POA: Diagnosis not present

## 2020-12-20 DIAGNOSIS — M9902 Segmental and somatic dysfunction of thoracic region: Secondary | ICD-10-CM | POA: Diagnosis not present

## 2020-12-20 DIAGNOSIS — M9901 Segmental and somatic dysfunction of cervical region: Secondary | ICD-10-CM | POA: Diagnosis not present

## 2020-12-20 NOTE — Telephone Encounter (Signed)
Requested records from [facility]. No colonoscopy or endoscopy performed there.   Please follow-up with the patient to obtain the colonoscopy results.  Thanks.

## 2020-12-21 ENCOUNTER — Encounter: Payer: Self-pay | Admitting: Gastroenterology

## 2020-12-23 DIAGNOSIS — M62838 Other muscle spasm: Secondary | ICD-10-CM | POA: Diagnosis not present

## 2020-12-23 DIAGNOSIS — M9905 Segmental and somatic dysfunction of pelvic region: Secondary | ICD-10-CM | POA: Diagnosis not present

## 2020-12-23 DIAGNOSIS — M9903 Segmental and somatic dysfunction of lumbar region: Secondary | ICD-10-CM | POA: Diagnosis not present

## 2020-12-23 DIAGNOSIS — M9902 Segmental and somatic dysfunction of thoracic region: Secondary | ICD-10-CM | POA: Diagnosis not present

## 2020-12-23 DIAGNOSIS — M9901 Segmental and somatic dysfunction of cervical region: Secondary | ICD-10-CM | POA: Diagnosis not present

## 2020-12-23 NOTE — Telephone Encounter (Signed)
Patient returned call. States need to refax request for records to [phone] ATTN: [place]

## 2020-12-23 NOTE — Telephone Encounter (Signed)
Called pt to inform of our failed attempt to obtain her records. Asked if she could help US obtain these records. Pt has been provided with our fax #. States she will take care of obtaining these records.

## 2020-12-23 NOTE — Telephone Encounter (Signed)
No longer have possession of original signed release. Pt will need sign a new release and return to our office. Mailed ROI to pt home address. Will fax once received.

## 2020-12-27 DIAGNOSIS — M9905 Segmental and somatic dysfunction of pelvic region: Secondary | ICD-10-CM | POA: Diagnosis not present

## 2020-12-27 DIAGNOSIS — M62838 Other muscle spasm: Secondary | ICD-10-CM | POA: Diagnosis not present

## 2020-12-27 DIAGNOSIS — M9902 Segmental and somatic dysfunction of thoracic region: Secondary | ICD-10-CM | POA: Diagnosis not present

## 2020-12-27 DIAGNOSIS — M9901 Segmental and somatic dysfunction of cervical region: Secondary | ICD-10-CM | POA: Diagnosis not present

## 2020-12-27 DIAGNOSIS — M9903 Segmental and somatic dysfunction of lumbar region: Secondary | ICD-10-CM | POA: Diagnosis not present

## 2020-12-29 ENCOUNTER — Other Ambulatory Visit: Payer: Self-pay

## 2020-12-29 ENCOUNTER — Encounter: Payer: Self-pay | Admitting: Gastroenterology

## 2020-12-29 ENCOUNTER — Ambulatory Visit (AMBULATORY_SURGERY_CENTER): Payer: BC Managed Care – PPO | Admitting: Gastroenterology

## 2020-12-29 VITALS — BP 111/72 | HR 64 | Temp 97.8°F | Resp 19 | Ht 64.0 in | Wt 127.0 lb

## 2020-12-29 DIAGNOSIS — K2950 Unspecified chronic gastritis without bleeding: Secondary | ICD-10-CM | POA: Diagnosis not present

## 2020-12-29 DIAGNOSIS — K449 Diaphragmatic hernia without obstruction or gangrene: Secondary | ICD-10-CM

## 2020-12-29 DIAGNOSIS — K2970 Gastritis, unspecified, without bleeding: Secondary | ICD-10-CM

## 2020-12-29 DIAGNOSIS — K2100 Gastro-esophageal reflux disease with esophagitis, without bleeding: Secondary | ICD-10-CM | POA: Diagnosis not present

## 2020-12-29 DIAGNOSIS — K219 Gastro-esophageal reflux disease without esophagitis: Secondary | ICD-10-CM

## 2020-12-29 MED ORDER — SODIUM CHLORIDE 0.9 % IV SOLN: 500.0000 mL | INTRAVENOUS | Status: DC

## 2020-12-29 NOTE — Progress Notes (Signed)
Report to PACU, RN, vss, BBS= Clear.  

## 2020-12-29 NOTE — Progress Notes (Signed)
VS by CW. ?

## 2020-12-29 NOTE — Patient Instructions (Signed)
Discharge instructions given. Handouts on Hiatal Hernia and Gastritis. Resume previous medications. YOU HAD AN ENDOSCOPIC PROCEDURE TODAY AT [facility]:   Refer to the procedure report that was given to you for any specific questions about what was found during the examination.  If the procedure report does not answer your questions, please call your gastroenterologist to clarify.  If you requested that your care partner not be given the details of your procedure findings, then the procedure report has been included in a sealed envelope for you to review at your convenience later.  YOU SHOULD EXPECT: Some feelings of bloating in the abdomen. Passage of more gas than usual.  Walking can help get rid of the air that was put into your GI tract during the procedure and reduce the bloating. If you had a lower endoscopy (such as a colonoscopy or flexible sigmoidoscopy) you may notice spotting of blood in your stool or on the toilet paper. If you underwent a bowel prep for your procedure, you may not have a normal bowel movement for a few days.  Please Note:  You might notice some irritation and congestion in your nose or some drainage.  This is from the oxygen used during your procedure.  There is no need for concern and it should clear up in a day or so.  SYMPTOMS TO REPORT IMMEDIATELY:   Following upper endoscopy (EGD)  Vomiting of blood or coffee ground material  New chest pain or pain under the shoulder blades  Painful or persistently difficult swallowing  New shortness of breath  Fever of 100F or higher  Black, tarry-looking stools  For urgent or emergent issues, a gastroenterologist can be reached at any hour by calling [phone]. Do not use MyChart messaging for urgent concerns.    DIET:  We do recommend a small meal at first, but then you may proceed to your regular diet.  Drink plenty of fluids but you should avoid alcoholic beverages for 24 hours.  ACTIVITY:  You  should plan to take it easy for the rest of today and you should NOT DRIVE or use heavy machinery until tomorrow (because of the sedation medicines used during the test).    FOLLOW UP: Our staff will call the number listed on your records 48-72 hours following your procedure to check on you and address any questions or concerns that you may have regarding the information given to you following your procedure. If we do not reach you, we will leave a message.  We will attempt to reach you two times.  During this call, we will ask if you have developed any symptoms of COVID 19. If you develop any symptoms (ie: fever, flu-like symptoms, shortness of breath, cough etc.) before then, please call [phone].  If you test positive for Covid 19 in the 2 weeks post procedure, please call and report this information to [place].    If any biopsies were taken you will be contacted by phone or by letter within the next 1-3 weeks.  Please call us at [phone] if you have not heard about the biopsies in 3 weeks.    SIGNATURES/CONFIDENTIALITY: You and/or your care partner have signed paperwork which will be entered into your electronic medical record.  These signatures attest to the fact that that the information above on your After Visit Summary has been reviewed and is understood.  Full responsibility of the confidentiality of this discharge information lies with you and/or your care-partner.

## 2020-12-29 NOTE — Op Note (Signed)
[place] Patient Name: Mindy Fuller Procedure Date: 12/29/2020 8:35 AM MRN: [id number] Endoscopist: Thornton Park MD, MD Age: 55 Referring MD:  Date of Birth: [birth year] Gender: Female Account #: [id number] Procedure:                Upper GI endoscopy Indications:              Suspected esophageal reflux - longstanding                            symptoms, EGD recommended to screen for Barrett's                            Esophagus                           Sjogren's syndrome Medicines:                Monitored Anesthesia Care Procedure:                Pre-Anesthesia Assessment:                           - Prior to the procedure, a History and Physical                            was performed, and patient medications and                            allergies were reviewed. The patient's tolerance of                            previous anesthesia was also reviewed. The risks                            and benefits of the procedure and the sedation                            options and risks were discussed with the patient.                            All questions were answered, and informed consent                            was obtained. Prior Anticoagulants: The patient has                            taken no previous anticoagulant or antiplatelet                            agents. ASA Grade Assessment: II - A patient with                            mild systemic disease. After reviewing the risks  and benefits, the patient was deemed in                            satisfactory condition to undergo the procedure.                           After obtaining informed consent, the endoscope was                            passed under direct vision. Throughout the                            procedure, the patient's blood pressure, pulse, and                            oxygen saturations were monitored continuously. The                             GIF [id number] #[id number] was introduced through the                            mouth, and advanced to the third part of duodenum.                            The upper GI endoscopy was accomplished without                            difficulty. The patient tolerated the procedure                            well. Scope In: Scope Out: Findings:                 LA Grade A (one or more mucosal breaks less than 5                            mm, not extending between tops of 2 mucosal folds)                            esophagitis with no bleeding was found 36 cm from                            the incisors. Biopsies were taken with a cold                            forceps for histology. Estimated blood loss was                            minimal.                           A small hiatal hernia was present.                           Diffuse mildly  erythematous mucosa without bleeding                            was found in the gastric body and in the gastric                            antrum. Biopsies were taken with a cold forceps for                            histology. Estimated blood loss was minimal.                           The examined duodenum was normal.                           The exam was otherwise without abnormality. Complications:            No immediate complications. Estimated blood loss:                            Minimal. Estimated Blood Loss:     Estimated blood loss was minimal. Impression:               - LA Grade A reflux esophagitis with no bleeding.                            Biopsied.                           - Small hiatal hernia.                           - Erythematous mucosa in the gastric body and                            antrum. Biopsied.                           - Normal examined duodenum.                           - The examination was otherwise normal. Recommendation:           - Patient has a contact number available for                             emergencies. The signs and symptoms of potential                            delayed complications were discussed with the                            patient. Return to normal activities tomorrow.                            Written discharge instructions were provided to the  patient.                           - Resume previous diet.                           - Continue present medications.                           - Consider PPI or H2B therapy for reflux and                            gastritis.                           - Continue reflux lifestyle modifications.                           - Await pathology results. Thornton Park MD, MD 12/29/2020 8:56:39 AM This report has been signed electronically.

## 2020-12-29 NOTE — Progress Notes (Signed)
Called to room to assist during endoscopic procedure.  Patient ID and intended procedure confirmed with present staff. Received instructions for my participation in the procedure from the performing physician.  

## 2020-12-29 NOTE — Progress Notes (Signed)
Referring Provider: Shon Baton, MD Primary Care Physician:  Shon Baton, MD   Indication for procedure:  Reflux   IMPRESSION:  Reflux in the setting of Sjogren's syndrome without interest in prescription medications due to chronic risks given her personal history of osteoporosis. Has historically had an EGD every 3 years to screen for Barrett's Esophagus, although this has never been diagnosed. Does not want to take medications. She is an appropriate candidate for monitored anesthesia care in the [facility] today.   PLAN: EGD    HPI: Mindy Fuller is a 55 y.o. female who presents for endoscopic evaluation of reflux. Please see my office note from 11/23/20 for full details. There has been no  change in history or physical exam.    Past Medical History:  Diagnosis Date   Acute glomerulonephritis    s/p COVID 2019   Anxiety    Blood transfusion without reported diagnosis    Constipation    COVID 2019   Extranodal marginal zone B-cell lymphoma (HCC)    Gastritis    GERD (gastroesophageal reflux disease)    Low libido    Sjogren's syndrome (HCC)    Tension    Vitamin D deficiency    Vocal cord nodules     Past Surgical History:  Procedure Laterality Date   BREAST CYST ASPIRATION     LUMBAR FUSION     L5-S1   ORIF METATARSAL FRACTURE Left    PAROTIDECTOMY Right    RENAL BIOPSY     TONSILLECTOMY      Prior to Admission medications   Medication Sig Start Date End Date Taking? Authorizing Provider  acetaminophen (TYLENOL) 500 MG tablet Take 1 tablet by mouth as needed. 04/20/19  Yes [provider]  ALPRAZolam (XANAX) 0.25 MG tablet Take 0.25 mg by mouth as needed. 09/26/19  Yes [provider]  busPIRone (BUSPAR) 10 MG tablet Take 10 mg by mouth every evening. 09/26/19  Yes [provider]  Cholecalciferol (VITAMIN D) 50 MCG (2000 UT) tablet Take 1 tablet by mouth daily.   Yes [provider]  esomeprazole (NEXIUM) 20 MG capsule Take 20 mg by  mouth as needed.   Yes [provider]  Lactobacillus Rhamnosus, GG, (RA PROBIOTIC DIGESTIVE CARE) CAPS Take 1 capsule by mouth daily.   Yes [provider]  losartan (COZAAR) 25 MG tablet Take 25 mg by mouth daily. 05/21/19  Yes [provider]  predniSONE (DELTASONE) 5 MG tablet Take 2.5 mg by mouth daily. 07/28/19  Yes [provider]  progesterone (PROMETRIUM) 100 MG capsule Take 100 mg by mouth daily as needed. 10/13/20  Yes [provider]  riTUXimab (RITUXAN) 100 MG/10ML injection See admin instructions. Every 6 months   Yes [provider]  traZODone (DESYREL) 100 MG tablet Take 1 tablet by mouth at bedtime.   Yes [provider]  Zinc 50 MG TABS Take 50 mg by mouth daily.   Yes [provider]    Current Outpatient Medications  Medication Sig Dispense Refill   ALPRAZolam (XANAX) 0.25 MG tablet Take 0.25 mg by mouth as needed.     busPIRone (BUSPAR) 10 MG tablet Take 10 mg by mouth every evening.     calcium carbonate (TUMS EX) 750 MG chewable tablet Chew 1 tablet by mouth daily.     cetirizine (ZYRTEC) 10 MG tablet Take 10 mg by mouth daily.     losartan (COZAAR) 25 MG tablet Take 25 mg by mouth daily.  progesterone (PROMETRIUM) 100 MG capsule Take 100 mg by mouth daily as needed.     traZODone (DESYREL) 100 MG tablet Take 1 tablet by mouth at bedtime.     triamcinolone (NASACORT ALLERGY 24HR) 55 MCG/ACT AERO nasal inhaler Place 2 sprays into the nose daily.     acetaminophen (TYLENOL) 500 MG tablet Take 1 tablet by mouth as needed.     Cholecalciferol (VITAMIN D) 50 MCG (2000 UT) tablet Take 1 tablet by mouth daily.     esomeprazole (NEXIUM) 20 MG capsule Take 20 mg by mouth as needed.     Lactobacillus Rhamnosus, GG, (RA PROBIOTIC DIGESTIVE CARE) CAPS Take 1 capsule by mouth daily.     omeprazole (PRILOSEC) 20 MG capsule Take 20 mg by mouth once.     riTUXimab (RITUXAN) 100 MG/10ML injection See admin  instructions. Every 6 months     Zinc 50 MG TABS Take 50 mg by mouth daily.     Current Facility-Administered Medications  Medication Dose Route Frequency Provider Last Rate Last Admin   0.9 %  sodium chloride infusion  500 mL Intravenous Continuous Thornton Park, MD       albuterol (VENTOLIN HFA) 108 (90 Base) MCG/ACT inhaler 2 puff  2 puff Inhalation Once PRN Gardenia Phlegm, NP       methylPREDNISolone sodium succinate (SOLU-MEDROL) 125 mg/2 mL injection 125 mg  125 mg Intramuscular Once PRN Gardenia Phlegm, NP        Allergies as of 12/29/2020 - Review Complete 12/29/2020  Allergen Reaction Noted   Clarithromycin Nausea And Vomiting and Nausea Only 01/24/2011   Amoxicillin Nausea Only 04/11/2019   Ciprofloxacin Nausea And Vomiting 10/19/2020   Hydrocodone-acetaminophen Other (See Comments) 09/28/2014    Family History  Problem Relation Age of Onset   Hypertension Mother    Other Mother        pre-diabetes   Coronary artery disease Father    Diabetes Mellitus I Father    Atrial fibrillation Father    Colon cancer Neg Hx    Esophageal cancer Neg Hx    Stomach cancer Neg Hx    Rectal cancer Neg Hx      Physical Exam: General:   Alert,  well-nourished, pleasant and cooperative in NAD Head:  Normocephalic and atraumatic. Eyes:  Sclera clear, no icterus.   Conjunctiva pink. Ears:  Normal auditory acuity. Nose:  No deformity, discharge,  or lesions. Mouth:  No deformity or lesions.   Neck:  Supple; no masses or thyromegaly. Lungs:  Clear throughout to auscultation.   No wheezes. Heart:  Regular rate and rhythm; no murmurs. Abdomen:  Soft, nontender, nondistended, normal bowel sounds, no rebound or guarding. No hepatosplenomegaly.   Rectal:  Deferred  Msk:  Symmetrical. No boney deformities LAD: No inguinal or umbilical LAD Extremities:  No clubbing or edema. Neurologic:  Alert and  oriented x4;  grossly nonfocal Skin:  Intact without significant  lesions or rashes. Psych:  Alert and cooperative. Normal mood and affect.     Jarod Bozzo L. Tarri Glenn, MD, MPH 12/29/2020, 8:35 AM

## 2020-12-31 ENCOUNTER — Telehealth: Payer: Self-pay | Admitting: *Deleted

## 2020-12-31 ENCOUNTER — Telehealth: Payer: Self-pay

## 2020-12-31 NOTE — Telephone Encounter (Signed)
  Follow up Call-  Call back number 12/29/2020  Post procedure Call Back phone  # [phone]  Permission to leave phone message Yes     Patient questions:  Message left to call us if necessary.

## 2020-12-31 NOTE — Telephone Encounter (Signed)
Second post procedure follow up call, left message 

## 2021-01-09 ENCOUNTER — Encounter: Payer: Self-pay | Admitting: Gastroenterology

## 2021-01-27 ENCOUNTER — Ambulatory Visit: Payer: BC Managed Care – PPO | Admitting: Gastroenterology

## 2021-02-02 DIAGNOSIS — N059 Unspecified nephritic syndrome with unspecified morphologic changes: Secondary | ICD-10-CM | POA: Diagnosis not present

## 2021-02-02 DIAGNOSIS — M3500 Sicca syndrome, unspecified: Secondary | ICD-10-CM | POA: Diagnosis not present

## 2021-02-02 DIAGNOSIS — M2550 Pain in unspecified joint: Secondary | ICD-10-CM | POA: Diagnosis not present

## 2021-02-02 DIAGNOSIS — R768 Other specified abnormal immunological findings in serum: Secondary | ICD-10-CM | POA: Diagnosis not present

## 2021-02-03 DIAGNOSIS — M545 Low back pain, unspecified: Secondary | ICD-10-CM | POA: Diagnosis not present

## 2021-02-16 DIAGNOSIS — M3500 Sicca syndrome, unspecified: Secondary | ICD-10-CM | POA: Diagnosis not present

## 2021-02-16 DIAGNOSIS — D472 Monoclonal gammopathy: Secondary | ICD-10-CM | POA: Diagnosis not present

## 2021-02-16 DIAGNOSIS — R809 Proteinuria, unspecified: Secondary | ICD-10-CM | POA: Diagnosis not present

## 2021-02-21 DIAGNOSIS — Z1231 Encounter for screening mammogram for malignant neoplasm of breast: Secondary | ICD-10-CM | POA: Diagnosis not present

## 2021-02-22 DIAGNOSIS — Z79899 Other long term (current) drug therapy: Secondary | ICD-10-CM | POA: Diagnosis not present

## 2021-02-22 DIAGNOSIS — N055 Unspecified nephritic syndrome with diffuse mesangiocapillary glomerulonephritis: Secondary | ICD-10-CM | POA: Diagnosis not present

## 2021-02-22 DIAGNOSIS — D472 Monoclonal gammopathy: Secondary | ICD-10-CM | POA: Diagnosis not present

## 2021-02-22 DIAGNOSIS — D84821 Immunodeficiency due to drugs: Secondary | ICD-10-CM | POA: Diagnosis not present

## 2021-02-23 DIAGNOSIS — M62838 Other muscle spasm: Secondary | ICD-10-CM | POA: Diagnosis not present

## 2021-02-23 DIAGNOSIS — M9905 Segmental and somatic dysfunction of pelvic region: Secondary | ICD-10-CM | POA: Diagnosis not present

## 2021-02-23 DIAGNOSIS — M9903 Segmental and somatic dysfunction of lumbar region: Secondary | ICD-10-CM | POA: Diagnosis not present

## 2021-02-23 DIAGNOSIS — M9901 Segmental and somatic dysfunction of cervical region: Secondary | ICD-10-CM | POA: Diagnosis not present

## 2021-02-23 DIAGNOSIS — M9902 Segmental and somatic dysfunction of thoracic region: Secondary | ICD-10-CM | POA: Diagnosis not present

## 2021-02-25 DIAGNOSIS — M9903 Segmental and somatic dysfunction of lumbar region: Secondary | ICD-10-CM | POA: Diagnosis not present

## 2021-02-25 DIAGNOSIS — M9905 Segmental and somatic dysfunction of pelvic region: Secondary | ICD-10-CM | POA: Diagnosis not present

## 2021-02-25 DIAGNOSIS — M9901 Segmental and somatic dysfunction of cervical region: Secondary | ICD-10-CM | POA: Diagnosis not present

## 2021-02-25 DIAGNOSIS — M9902 Segmental and somatic dysfunction of thoracic region: Secondary | ICD-10-CM | POA: Diagnosis not present

## 2021-02-25 DIAGNOSIS — M62838 Other muscle spasm: Secondary | ICD-10-CM | POA: Diagnosis not present

## 2021-04-07 DIAGNOSIS — M3500 Sicca syndrome, unspecified: Secondary | ICD-10-CM | POA: Diagnosis not present

## 2021-04-07 DIAGNOSIS — M3130 Wegener's granulomatosis without renal involvement: Secondary | ICD-10-CM | POA: Diagnosis not present

## 2021-04-07 DIAGNOSIS — N059 Unspecified nephritic syndrome with unspecified morphologic changes: Secondary | ICD-10-CM | POA: Diagnosis not present

## 2021-04-11 DIAGNOSIS — C8510 Unspecified B-cell lymphoma, unspecified site: Secondary | ICD-10-CM | POA: Diagnosis not present

## 2021-04-18 DIAGNOSIS — R55 Syncope and collapse: Secondary | ICD-10-CM | POA: Diagnosis not present

## 2021-04-20 ENCOUNTER — Other Ambulatory Visit: Payer: Self-pay | Admitting: Internal Medicine

## 2021-04-20 DIAGNOSIS — E7800 Pure hypercholesterolemia, unspecified: Secondary | ICD-10-CM

## 2021-05-02 ENCOUNTER — Other Ambulatory Visit: Payer: Self-pay | Admitting: *Deleted

## 2021-05-02 DIAGNOSIS — C8580 Other specified types of non-Hodgkin lymphoma, unspecified site: Secondary | ICD-10-CM

## 2021-05-03 ENCOUNTER — Other Ambulatory Visit: Payer: Self-pay

## 2021-05-03 ENCOUNTER — Inpatient Hospital Stay (HOSPITAL_BASED_OUTPATIENT_CLINIC_OR_DEPARTMENT_OTHER): Payer: BC Managed Care – PPO | Admitting: Hematology

## 2021-05-03 ENCOUNTER — Inpatient Hospital Stay: Payer: BC Managed Care – PPO | Attending: Hematology

## 2021-05-03 VITALS — BP 120/70 | HR 71 | Temp 98.2°F | Resp 15 | Ht 64.0 in | Wt 129.4 lb

## 2021-05-03 DIAGNOSIS — C884 Extranodal marginal zone B-cell lymphoma of mucosa-associated lymphoid tissue [MALT-lymphoma]: Secondary | ICD-10-CM | POA: Insufficient documentation

## 2021-05-03 DIAGNOSIS — N049 Nephrotic syndrome with unspecified morphologic changes: Secondary | ICD-10-CM | POA: Diagnosis not present

## 2021-05-03 DIAGNOSIS — Z885 Allergy status to narcotic agent status: Secondary | ICD-10-CM | POA: Diagnosis not present

## 2021-05-03 DIAGNOSIS — Z8616 Personal history of COVID-19: Secondary | ICD-10-CM | POA: Insufficient documentation

## 2021-05-03 DIAGNOSIS — M2550 Pain in unspecified joint: Secondary | ICD-10-CM | POA: Insufficient documentation

## 2021-05-03 DIAGNOSIS — M3500 Sicca syndrome, unspecified: Secondary | ICD-10-CM | POA: Diagnosis not present

## 2021-05-03 DIAGNOSIS — H6591 Unspecified nonsuppurative otitis media, right ear: Secondary | ICD-10-CM | POA: Insufficient documentation

## 2021-05-03 DIAGNOSIS — Z8719 Personal history of other diseases of the digestive system: Secondary | ICD-10-CM | POA: Insufficient documentation

## 2021-05-03 DIAGNOSIS — Z880 Allergy status to penicillin: Secondary | ICD-10-CM | POA: Insufficient documentation

## 2021-05-03 DIAGNOSIS — Z79620 Long term (current) use of immunosuppressive biologic: Secondary | ICD-10-CM | POA: Diagnosis not present

## 2021-05-03 DIAGNOSIS — Z79899 Other long term (current) drug therapy: Secondary | ICD-10-CM | POA: Diagnosis not present

## 2021-05-03 DIAGNOSIS — C8580 Other specified types of non-Hodgkin lymphoma, unspecified site: Secondary | ICD-10-CM

## 2021-05-03 DIAGNOSIS — Z8249 Family history of ischemic heart disease and other diseases of the circulatory system: Secondary | ICD-10-CM | POA: Insufficient documentation

## 2021-05-03 DIAGNOSIS — Z881 Allergy status to other antibiotic agents status: Secondary | ICD-10-CM | POA: Diagnosis not present

## 2021-05-03 DIAGNOSIS — Z833 Family history of diabetes mellitus: Secondary | ICD-10-CM | POA: Insufficient documentation

## 2021-05-03 LAB — CMP (CANCER CENTER ONLY)
ALT: 16 U/L (ref 10–47)
AST: 26 U/L (ref 11–38)
Albumin: 4.2 g/dL (ref 3.5–5.0)
Alkaline Phosphatase: 57 U/L (ref 38–126)
Anion gap: 4 — ABNORMAL LOW (ref 5–15)
BUN: 18 mg/dL (ref 6–20)
CO2: 30 mmol/L (ref 22–32)
Calcium: 9.4 mg/dL (ref 8.9–10.3)
Chloride: 105 mmol/L (ref 98–111)
Creatinine: 0.82 mg/dL (ref 0.60–1.20)
GFR, Estimated: >60 mL/min (ref >60)
Glucose, Bld: 102 mg/dL — ABNORMAL HIGH (ref 70–99)
Potassium: 4 mmol/L (ref 3.5–5.1)
Sodium: 139 mmol/L (ref 135–145)
Total Bilirubin: 0.4 mg/dL (ref 0.2–1.6)
Total Protein: 6.6 g/dL (ref 6.5–8.1)

## 2021-05-03 LAB — CBC WITH DIFFERENTIAL (CANCER CENTER ONLY)
Abs Immature Granulocytes: 0 10*3/uL (ref 0.00–0.07)
Basophils Absolute: 0 10*3/uL (ref 0.0–0.1)
Basophils Relative: 0 %
Eosinophils Absolute: 0.1 10*3/uL (ref 0.0–0.5)
Eosinophils Relative: 2 %
HCT: 38.7 % (ref 36.0–46.0)
Hemoglobin: 13 g/dL (ref 12.0–15.0)
Immature Granulocytes: 0 %
Lymphocytes Relative: 28 %
Lymphs Abs: 1.4 10*3/uL (ref 0.7–4.0)
MCH: 30.4 pg (ref 26.0–34.0)
MCHC: 33.6 g/dL (ref 30.0–36.0)
MCV: 90.6 fL (ref 80.0–100.0)
Monocytes Absolute: 0.4 10*3/uL (ref 0.1–1.0)
Monocytes Relative: 7 %
Neutro Abs: 3.1 10*3/uL (ref 1.7–7.7)
Neutrophils Relative %: 63 %
Platelet Count: 229 10*3/uL (ref 150–400)
RBC: 4.27 MIL/uL (ref 3.87–5.11)
RDW: 12.4 % (ref 11.5–15.5)
WBC Count: 4.9 10*3/uL (ref 4.0–10.5)
nRBC: 0 % (ref 0.0–0.2)

## 2021-05-03 LAB — LACTATE DEHYDROGENASE: LDH: 185 U/L (ref 98–192)

## 2021-05-03 MED ORDER — DOXYCYCLINE HYCLATE 100 MG PO TABS: 100.0000 mg | ORAL_TABLET | Freq: Two times a day (BID) | ORAL | 0 refills | Status: AC

## 2021-05-03 NOTE — Progress Notes (Signed)
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 Patient has a history of Sjogren's syndrome diagnosed per lip biopsy about 14 years ago.  She has had chronic sicca syndrome.  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  This has been treated with Rituxan and she continues to be on maintenance Rituxan every 6 months as per her nephrologist.  INTERVAL HISTORY  Mindy Fuller is a 56 y/o female pt presenting with right facial nerve pain. For the last 2 weeks, Pt describes nerve pain as a shooting sensation through right cervical area near right ear. She is also experiencing insomnia due to being woken up by the nerve pain and denies any accompanying symptoms. She says the nerve pain has worsened over the last couple of weeks. When she ingests anything sour she feels discomfort and avoids sour foods as a result. She is getting established with a new ENT in March 2023. Pt PCP has told her she is not able to take any NSAIDS to treat current pain.   She also presents with dry mouth and congestion.  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  Pt expresses that she has occasional joint pain that comes and goes.   Pt contracted COVID-19 in November and July of 2022.   MEDICAL HISTORY:  Past Medical History:   Diagnosis Date   Acute glomerulonephritis    s/p COVID 2019   Anxiety    Blood transfusion without reported diagnosis    Constipation    COVID 2019   Extranodal marginal zone B-cell lymphoma (HCC)    Gastritis    GERD (gastroesophageal reflux disease)    Low libido    Sjogren's syndrome (HCC)    Tension    Vitamin D deficiency    Vocal cord nodules   Follows with Dr. Renette Butters for her glomerulonephritis   SURGICAL HISTORY Right superficial parotidectomy for extranodal marginal zone lymphoma Kidney biopsy 03/2019 History of breast biopsy benign cyst aspirated   SOCIAL HISTORY: Social History   Socioeconomic History   Marital status: Married    Spouse name: Not on file   Number of children: 2   Years of education: Not on file   Highest education level: Not on file  Occupational History   Occupation: Self Employed  Tobacco Use   Smoking status: Never    Passive exposure: Never   Smokeless tobacco: Never  Vaping Use   Vaping Use: Never used  Substance and Sexual Activity   Alcohol use: Yes    Comment: once a month   Drug use: Never   Sexual activity: Not on file  Other Topics Concern   Not on file  Social History Narrative   Not on file   Social Determinants of Health   Financial Resource Strain: Not on file  Food Insecurity: Not on file  Transportation Needs: Not on file  Physical Activity:  Not on file  Stress: Not on file  Social Connections: Not on file  Intimate Partner Violence: Not on file  Non-smoker nondrinker. 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   FAMILY HISTORY: Family History  Problem Relation Age of Onset   Hypertension Mother    Other Mother        pre-diabetes   Coronary artery disease Father    Diabetes Mellitus I Father    Atrial fibrillation Father    Colon cancer Neg Hx    Esophageal cancer Neg Hx    Stomach cancer Neg Hx    Rectal cancer Neg Hx     ALLERGIES:  is allergic to clarithromycin, amoxicillin,  ciprofloxacin, and hydrocodone-acetaminophen.  MEDICATIONS:  Current Outpatient Medications  Medication Sig Dispense Refill   acetaminophen (TYLENOL) 500 MG tablet Take 1 tablet by mouth as needed.     ALPRAZolam (XANAX) 0.25 MG tablet Take 0.25 mg by mouth as needed.     busPIRone (BUSPAR) 10 MG tablet Take 10 mg by mouth every evening.     calcium carbonate (TUMS EX) 750 MG chewable tablet Chew 1 tablet by mouth daily.     cetirizine (ZYRTEC) 10 MG tablet Take 10 mg by mouth daily.     Cholecalciferol (VITAMIN D) 50 MCG (2000 UT) tablet Take 1 tablet by mouth daily.     esomeprazole (NEXIUM) 20 MG capsule Take 20 mg by mouth as needed.     Lactobacillus Rhamnosus, GG, (RA PROBIOTIC DIGESTIVE CARE) CAPS Take 1 capsule by mouth daily.     losartan (COZAAR) 25 MG tablet Take 25 mg by mouth daily.     omeprazole (PRILOSEC) 20 MG capsule Take 20 mg by mouth once.     progesterone (PROMETRIUM) 100 MG capsule Take 100 mg by mouth daily as needed.     riTUXimab (RITUXAN) 100 MG/10ML injection See admin instructions. Every 6 months     traZODone (DESYREL) 100 MG tablet Take 1 tablet by mouth at bedtime.     triamcinolone (NASACORT ALLERGY 24HR) 55 MCG/ACT AERO nasal inhaler Place 2 sprays into the nose daily.     Zinc 50 MG TABS Take 50 mg by mouth daily.     Current Facility-Administered Medications  Medication Dose Route Frequency Provider Last Rate Last Admin   albuterol (VENTOLIN HFA) 108 (90 Base) MCG/ACT inhaler 2 puff  2 puff Inhalation Once PRN Causey, Charlestine Massed, NP       methylPREDNISolone sodium succinate (SOLU-MEDROL) 125 mg/2 mL injection 125 mg  125 mg Intramuscular Once PRN Causey, Charlestine Massed, NP        REVIEW OF SYSTEMS:    No fevers no chills no night sweats no obvious new lumps or bumps. No new skin rashes.  PHYSICAL EXAMINATION: ECOG PERFORMANCE STATUS: 1 - Symptomatic but completely ambulatory  . Vitals:   05/03/21 1004  BP: 120/70  Pulse: 71  Resp: 15   Temp: 98.2 F (36.8 C)  SpO2: 99%    Filed Weights   05/03/21 1004  Weight: 129 lb 6.4 oz (58.7 kg)    .Body mass index is 22.21 kg/m.  NAD GENERAL:alert, in no acute distress and comfortable SKIN: no acute rashes, no significant lesions EYES: conjunctiva are pink and non-injected, sclera anicteric OROPHARYNX: MMM, no exudates, no oropharyngeal erythema or ulceration NECK: supple, no JVD LYMPH:  no palpable lymphadenopathy in the cervical, axillary or inguinal regions LUNGS: clear to auscultation b/l with normal respiratory effort HEART: regular rate &  rhythm ABDOMEN:  normoactive bowel sounds , non tender, not distended.  No palpable hepatosplenomegaly Extremity: no pedal edema PSYCH: alert & oriented x 3 with fluent speech NEURO: no focal motor/sensory deficits   LABORATORY DATA:  I have reviewed the data as listed  . CBC Latest Ref Rng & Units 05/03/2021  WBC 4.0 - 10.5 K/uL 4.9  Hemoglobin 12.0 - 15.0 g/dL 13.0  Hematocrit 36.0 - 46.0 % 38.7  Platelets 150 - 400 K/uL 229    . CMP Latest Ref Rng & Units 05/03/2021  Glucose 70 - 99 mg/dL 102(H)  BUN 6 - 20 mg/dL 18  Creatinine 0.60 - 1.20 mg/dL 0.82  Sodium 135 - 145 mmol/L 139  Potassium 3.5 - 5.1 mmol/L 4.0  Chloride 98 - 111 mmol/L 105  CO2 22 - 32 mmol/L 30  Calcium 8.9 - 10.3 mg/dL 9.4  Total Protein 6.5 - 8.1 g/dL 6.6  Total Bilirubin 0.2 - 1.6 mg/dL 0.4  Alkaline Phos 38 - 126 U/L 57  AST 11 - 38 U/L 26  ALT 10 - 47 U/L 16     Lab Results  Component Value Date   LDH 185 05/03/2021    RADIOGRAPHIC STUDIES: I have personally reviewed the radiological images as listed and agreed with the findings in the report. No results found.  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  Likely related to her Sjogren's  disease Plan -Patient's labs from today show normal CBC CMP and LDH. -Patient continues to be on maintenance Rituxan every 6 months for her COVID glomerulonephritis which also likely helps with her marginal zone lymphoma. -She notes some right ear and right facial discomfort.  She is concerned about. -We will get CT neck soft tissue and CT maxillofacial without contrast due to her previous significant renal failure from glomerulonephritis. -She has a referral to ENT and will be seeing them in March -Given prescription for short course of doxycycline to address otitis media -She has prednisone to take short tapered dose.  To help with any inflammation/painful swelling.  -FOLLOW UP:  -CT neck/face in 1 week Phone visit in 2 weeks   All of the patients questions were answered with apparent satisfaction. The patient knows to call the clinic with any problems, questions or concerns.    Sullivan Lone MD [place] [facility] Hematology/Oncology Physician [facility]

## 2021-05-04 ENCOUNTER — Telehealth: Payer: Self-pay | Admitting: Hematology

## 2021-05-04 NOTE — Telephone Encounter (Signed)
Scheduled follow-up appointment per 2/14 los. Patient is aware.

## 2021-05-09 ENCOUNTER — Other Ambulatory Visit: Payer: Self-pay

## 2021-05-09 ENCOUNTER — Ambulatory Visit (HOSPITAL_COMMUNITY)
Admission: RE | Admit: 2021-05-09 | Discharge: 2021-05-09 | Disposition: A | Discharge status: Home / Self Care | Payer: BC Managed Care – PPO | Source: Ambulatory Visit | Attending: Hematology | Admitting: Hematology

## 2021-05-09 DIAGNOSIS — H6591 Unspecified nonsuppurative otitis media, right ear: Secondary | ICD-10-CM | POA: Insufficient documentation

## 2021-05-09 DIAGNOSIS — C884 Extranodal marginal zone B-cell lymphoma of mucosa-associated lymphoid tissue [MALT-lymphoma]: Secondary | ICD-10-CM | POA: Diagnosis not present

## 2021-05-09 DIAGNOSIS — C8580 Other specified types of non-Hodgkin lymphoma, unspecified site: Secondary | ICD-10-CM | POA: Diagnosis not present

## 2021-05-09 DIAGNOSIS — I6521 Occlusion and stenosis of right carotid artery: Secondary | ICD-10-CM | POA: Diagnosis not present

## 2021-05-09 DIAGNOSIS — R519 Headache, unspecified: Secondary | ICD-10-CM | POA: Diagnosis not present

## 2021-05-13 ENCOUNTER — Telehealth: Payer: Self-pay | Admitting: Hematology

## 2021-05-13 NOTE — Telephone Encounter (Signed)
Called patient and discussed   CT soft tissue of the neck/maxillofacial   shows no neck masses or adenopathy. Right mastoid air cells and middle ear are clear. Cervical spine degenerative changes Atrophy and heterogeneity of the salivary glands likely from Sjogren's no definitive masslike soft tissue.   -Patient notes her right ear pain and fullness is better.  She is still having some facial nerve pain which she will discuss with her at her upcoming ENT visit to rule out any entrapment of the nerve.  No additional oncologic work-up or intervention recommended at this time. We shall see her back in 1 year with labs. Her upcoming phone visit next week will be canceled.

## 2021-05-17 ENCOUNTER — Ambulatory Visit: Payer: BC Managed Care – PPO | Admitting: Hematology

## 2021-05-23 DIAGNOSIS — M3500 Sicca syndrome, unspecified: Secondary | ICD-10-CM | POA: Diagnosis not present

## 2021-05-25 DIAGNOSIS — C8510 Unspecified B-cell lymphoma, unspecified site: Secondary | ICD-10-CM | POA: Diagnosis not present

## 2021-05-25 DIAGNOSIS — R809 Proteinuria, unspecified: Secondary | ICD-10-CM | POA: Diagnosis not present

## 2021-05-25 DIAGNOSIS — K219 Gastro-esophageal reflux disease without esophagitis: Secondary | ICD-10-CM | POA: Diagnosis not present

## 2021-05-25 DIAGNOSIS — N055 Unspecified nephritic syndrome with diffuse mesangiocapillary glomerulonephritis: Secondary | ICD-10-CM | POA: Diagnosis not present

## 2021-05-27 ENCOUNTER — Ambulatory Visit
Admission: RE | Admit: 2021-05-27 | Discharge: 2021-05-27 | Disposition: A | Discharge status: Home / Self Care | Payer: No Typology Code available for payment source | Source: Ambulatory Visit | Attending: Internal Medicine | Admitting: Internal Medicine

## 2021-05-27 DIAGNOSIS — E7800 Pure hypercholesterolemia, unspecified: Secondary | ICD-10-CM

## 2021-06-13 DIAGNOSIS — K2900 Acute gastritis without bleeding: Secondary | ICD-10-CM | POA: Diagnosis not present

## 2021-06-17 ENCOUNTER — Telehealth: Payer: Self-pay | Admitting: Hematology

## 2021-06-17 NOTE — Telephone Encounter (Signed)
.  Called pt per 2/24 inbasket , Patient was unavailable, a message with appt time and date was left with number on file.   ?

## 2021-06-30 NOTE — Progress Notes (Signed)
? ? ?07/04/2021 ?Mindy Fuller ?[id number] ?[birth year] ? ?Referring provider: Shon Baton, MD ?Primary GI doctor: Dr. Tarri Glenn ? ?ASSESSMENT AND PLAN:  ? ? ?Gastroesophageal reflux disease, unspecified whether esophagitis present ?EGD 2022 chronic gastriis, negative biopsies ? ?Constipation, unspecified constipation type ?Worsening symptoms, continue miralax, can consider linzess.  ? ?AB pain ?With constipation, AB pain and worsening symptoms ?Has been 7-8 years since colon ?CT AB and pelvis 2021 without issues ?Long discussion the patient since it has been such a long time since last colon she would like to proceed.  ?If negative can consider imaging verus xifaxin.  ?Will get on IBGARD, continue miralax.  ? ?History of Present Illness:  ?56 y.o. female  with a past medical history of Sjogren's syndrome, s/p choley, extranodal marginal zone B-cell lymphoma s/p prior parotidectomy 2014, osteoporosis, vitamin D deficiency, chronic constipation, on chronic steroids for glomerulonephritis occurring after COVID and others listed below returns to clinic today for evaluation of GERD. ? 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  This has been treated with Rituxan and she continues to be on maintenance Rituxan every 6 months as per her nephrologist.  ?Following Dr. Irene Limbo for parotid extranodal marginal zone lymphoma, CT soft tissue neck without contrast, unremarkable.  ? ?12/29/2020 had endoscopy with Dr. Tarri Glenn for reflux showed grade A reflux esophagitis no biopsies obtained, small hiatal hernia, erythematous mucosa in gastric body and antrum.  ?Chronic gastritis negative for H pylori.  ?Colonoscopy 7-8 years ago, can not see report,she will call to get report, diverticulosis per patient.  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 ? ?She states she has long term history of AB pain,  diagnosed "sphincter of oddi dysfunction" no biliary colic in years, would get elevated LFTs at the time but has had gastritis.  ?She states she can tell when she will get a lot of gastritis, will get burping. ?Changed to protonix twice a day, and the GERD has improved but still there not as bad but AB is very tender.  ?She has GERD with acid or something coming up, worse with bending over, no nausea or vomiting.  No early satiety or weight loss.  ?She has epigastric and LUQ tenderness.  ?Cramping and bloating. Has chronic constipation, takes miralax once a day a day while in [place] last 12 days.  ?She can feel food/things moving through over left upper quadrant.  ?After all day of eating, at night mainly will  ? ?Has been off steroids since 2021. ?She does not take NSAIDS.  ?She drinks a beer a month at the most. ? ?CBC  05/03/2021  ?HGB 13.0 MCV 90.6 without evidence of anemia ?WBC 4.9 Platelets 229 ?Kidney function 05/03/2021  ?BUN 18 Cr 0.82  ?GFR >60  ?Potassium 4.0   ?LFTs 05/03/2021  ?AST 26 ALT 16 ?Alkphos 57 TBili 0.4 ? ? ?Current Medications:  ? ? ?Current Facility-Administered Medications (Endocrine & Metabolic):  ?  methylPREDNISolone sodium succinate (SOLU-MEDROL) 125 mg/2 mL injection 125 mg ? ? ? ?Current Outpatient Medications (Respiratory):  ?  cetirizine (ZYRTEC) 10 MG tablet, Take 10 mg by mouth daily. ?  triamcinolone (NASACORT) 55 MCG/ACT AERO nasal inhaler, Place 2 sprays into the nose daily. ? ?Current Facility-Administered Medications (Respiratory):  ?  albuterol (VENTOLIN HFA) 108 (90 Base) MCG/ACT inhaler 2 puff ? ?Current Outpatient Medications (Analgesics):  ?  acetaminophen (TYLENOL) 500 MG tablet, Take 1  tablet by mouth as needed. ? ? ? ? ?Current Outpatient Medications (Other):  ?  ALPRAZolam (XANAX) 0.25 MG tablet, Take 0.25 mg by mouth as needed. ?  calcium carbonate (TUMS EX) 750 MG chewable tablet, Chew 1 tablet by mouth daily. ?  Lactobacillus Rhamnosus, GG, (RA PROBIOTIC DIGESTIVE  CARE) CAPS, Take 1 capsule by mouth daily. ?  pantoprazole (PROTONIX) 40 MG tablet, Take 40 mg by mouth 2 (two) times daily. ?  riTUXimab (RITUXAN) 100 MG/10ML injection, See admin instructions. Every 6 months ?  traZODone (DESYREL) 100 MG tablet, Take 1 tablet by mouth at bedtime. ? ? ?Surgical History:  ?She  has a past surgical history that includes Parotidectomy (Right); Breast cyst aspiration; Renal biopsy; ORIF metatarsal fracture (Left); Lumbar fusion; and Tonsillectomy. ?Family History:  ?Her family history includes Atrial fibrillation in her father; Coronary artery disease in her father; Diabetes Mellitus I in her father; Hypertension in her mother; Other in her mother. ?Social History:  ? reports that she has never smoked. She has never been exposed to tobacco smoke. She has never used smokeless tobacco. She reports current alcohol use. She reports that she does not use drugs. ? ?Current Medications, Allergies, Past Medical History, Past Surgical History, Family History and Social History were reviewed in [employer] record. ? ?Physical Exam: ?BP 110/78   Pulse 76   Ht '5\' 4"'$  (1.626 m)   Wt 130 lb 3.2 oz (59.1 kg)   BMI 22.35 kg/m?  ?General:   Pleasant, well developed female in no acute distress ?Heart:  Regular rate and rhythm; no murmurs ?Pulm: Clear anteriorly; no wheezing ?Abdomen:  Soft, Non-distended AB, Active bowel sounds. mild tenderness in the epigastrium, in the LUQ, and in the lower abdomen. Without guarding and Without rebound, No organomegaly appreciated. ?Extremities:  without  edema. ?Neurologic:  Alert and  oriented x4;  No focal deficits.  ?Psych:  Cooperative. Normal mood and affect. ? ? ?Vladimir Crofts, PA-C ?07/04/21 ?

## 2021-07-04 ENCOUNTER — Ambulatory Visit (INDEPENDENT_AMBULATORY_CARE_PROVIDER_SITE_OTHER): Payer: BC Managed Care – PPO | Admitting: Physician Assistant

## 2021-07-04 VITALS — BP 110/78 | HR 76 | Ht 64.0 in | Wt 130.2 lb

## 2021-07-04 DIAGNOSIS — K5900 Constipation, unspecified: Secondary | ICD-10-CM

## 2021-07-04 DIAGNOSIS — R1084 Generalized abdominal pain: Secondary | ICD-10-CM

## 2021-07-04 DIAGNOSIS — K219 Gastro-esophageal reflux disease without esophagitis: Secondary | ICD-10-CM | POA: Diagnosis not present

## 2021-07-04 NOTE — Progress Notes (Signed)
Reviewed and agree with management plans. ? ?Monique Gift L. Haillee Johann, MD, MPH  ?

## 2021-07-04 NOTE — Patient Instructions (Addendum)
Please try low FODMAP diet ?Try trial off milk/lactose products.  ?Add fiber like benefiber or citracel once a day, do the miralax at night. ?Increase activity ?Can do trial of IBGard for AB pain- Take 1-2 capsules once a day for maintence or twice a day during a flare ? ?if any worsening symptoms like blood in stool, weight loss, please call the office or go to the ER.  ? ?Please take your proton pump inhibitor medication 30 minutes to 1 hour before meals- this makes it more effective.  ?Avoid spicy and acidic foods ?Avoid fatty foods ?Limit your intake of coffee, tea, alcohol, and carbonated drinks ?Work to maintain a healthy weight ?Keep the head of the bed elevated at least 3 inches with blocks or a wedge pillow if you are having any nighttime symptoms ?Stay upright for 2 hours after eating ?Avoid meals and snacks three to four hours before bedtime ? ? ?Gastroesophageal Reflux Disease, Adult ?Gastroesophageal reflux (GER) happens when acid from the stomach flows up into the tube that connects the mouth and the stomach (esophagus). Normally, food travels down the esophagus and stays in the stomach to be digested. However, when a person has GER, food and stomach acid sometimes move back up into the esophagus. If this becomes a more serious problem, the person may be diagnosed with a disease called gastroesophageal reflux disease (GERD). GERD occurs when the reflux: ?Happens often. ?Causes frequent or severe symptoms. ?Causes problems such as damage to the esophagus. ?When stomach acid comes in contact with the esophagus, the acid may cause inflammation in the esophagus. Over time, GERD may create small holes (ulcers) in the lining of the esophagus. ?What are the causes? ?This condition is caused by a problem with the muscle between the esophagus and the stomach (lower esophageal sphincter, or LES). Normally, the LES muscle closes after food passes through the esophagus to the stomach. When the LES is weakened or  abnormal, it does not close properly, and that allows food and stomach acid to go back up into the esophagus. ?The LES can be weakened by certain dietary substances, medicines, and medical conditions, including: ?Tobacco use. ?Pregnancy. ?Having a hiatal hernia. ?Alcohol use. ?Certain foods and beverages, such as coffee, chocolate, onions, and peppermint. ?What increases the risk? ?You are more likely to develop this condition if you: ?Have an increased body weight. ?Have a connective tissue disorder. ?Take NSAIDs, such as ibuprofen. ?What are the signs or symptoms? ?Symptoms of this condition include: ?Heartburn. ?Difficult or painful swallowing and the feeling of having a lump in the throat. ?A bitter taste in the mouth. ?Bad breath and having a large amount of saliva. ?Having an upset or bloated stomach and belching. ?Chest pain. Different conditions can cause chest pain. Make sure you see your health care provider if you experience chest pain. ?Shortness of breath or wheezing. ?Ongoing (chronic) cough or a nighttime cough. ?Wearing away of tooth enamel. ?Weight loss. ?How is this diagnosed? ?This condition may be diagnosed based on a medical history and a physical exam. To determine if you have mild or severe GERD, your health care provider may also monitor how you respond to treatment. You may also have tests, including: ?A test to examine your stomach and esophagus with a small camera (endoscopy). ?A test that measures the acidity level in your esophagus. ?A test that measures how much pressure is on your esophagus. ?A barium swallow or modified barium swallow test to show the shape, size, and functioning of  your esophagus. ?How is this treated? ?Treatment for this condition may vary depending on how severe your symptoms are. Your health care provider may recommend: ?Changes to your diet. ?Medicine. ?Surgery. ?The goal of treatment is to help relieve your symptoms and to prevent complications. ?Follow these  instructions at home: ?Eating and drinking ? ?Follow a diet as recommended by your health care provider. This may involve avoiding foods and drinks such as: ?Coffee and tea, with or without caffeine. ?Drinks that contain alcohol. ?Energy drinks and sports drinks. ?Carbonated drinks or sodas. ?Chocolate and cocoa. ?Peppermint and mint flavorings. ?Garlic and onions. ?Horseradish. ?Spicy and acidic foods, including peppers, chili powder, curry powder, vinegar, hot sauces, and barbecue sauce. ?Citrus fruit juices and citrus fruits, such as oranges, lemons, and limes. ?Tomato-based foods, such as red sauce, chili, salsa, and pizza with red sauce. ?Fried and fatty foods, such as donuts, french fries, potato chips, and high-fat dressings. ?High-fat meats, such as hot dogs and fatty cuts of red and white meats, such as rib eye steak, sausage, ham, and bacon. ?High-fat dairy items, such as whole milk, butter, and cream cheese. ?Eat small, frequent meals instead of large meals. ?Avoid drinking large amounts of liquid with your meals. ?Avoid eating meals during the 2-3 hours before bedtime. ?Avoid lying down right after you eat. ?Do not exercise right after you eat. ?Lifestyle ? ?Do not use any products that contain nicotine or tobacco. These products include cigarettes, chewing tobacco, and vaping devices, such as e-cigarettes. If you need help quitting, ask your health care provider. ?Try to reduce your stress by using methods such as yoga or meditation. If you need help reducing stress, ask your health care provider. ?If you are overweight, reduce your weight to an amount that is healthy for you. Ask your health care provider for guidance about a safe weight loss goal. ?General instructions ?Pay attention to any changes in your symptoms. ?Take over-the-counter and prescription medicines only as told by your health care provider. Do not take aspirin, ibuprofen, or other NSAIDs unless your health care provider told you to  take these medicines. ?Wear loose-fitting clothing. Do not wear anything tight around your waist that causes pressure on your abdomen. ?Raise (elevate) the head of your bed about 6 inches (15 cm). You can use a wedge to do this. ?Avoid bending over if this makes your symptoms worse. ?Keep all follow-up visits. This is important. ?Contact a health care provider if: ?You have: ?New symptoms. ?Unexplained weight loss. ?Difficulty swallowing or it hurts to swallow. ?Wheezing or a persistent cough. ?A hoarse voice. ?Your symptoms do not improve with treatment. ?Get help right away if: ?You have sudden pain in your arms, neck, jaw, teeth, or back. ?You suddenly feel sweaty, dizzy, or light-headed. ?You have chest pain or shortness of breath. ?You vomit and the vomit is green, yellow, or black, or it looks like blood or coffee grounds. ?You faint. ?You have stool that is red, bloody, or black. ?You cannot swallow, drink, or eat. ?These symptoms may represent a serious problem that is an emergency. Do not wait to see if the symptoms will go away. Get medical help right away. Call your local emergency services (911 in the [place] Do not drive yourself to the hospital. ?Summary ?Gastroesophageal reflux happens when acid from the stomach flows up into the esophagus. GERD is a disease in which the reflux happens often, causes frequent or severe symptoms, or causes problems such as damage to the esophagus. ?Treatment  for this condition may vary depending on how severe your symptoms are. Your health care provider may recommend diet and lifestyle changes, medicine, or surgery. ?Contact a health care provider if you have new or worsening symptoms. ?Take over-the-counter and prescription medicines only as told by your health care provider. Do not take aspirin, ibuprofen, or other NSAIDs unless your health care provider told you to do so. ?Keep all follow-up visits as told by your health care provider. This is important. ?This  information is not intended to replace advice given to you by your health care provider. Make sure you discuss any questions you have with your health care provider. ?Document Revised: 09/15/2019 Document [place]

## 2021-07-25 DIAGNOSIS — Z01419 Encounter for gynecological examination (general) (routine) without abnormal findings: Secondary | ICD-10-CM | POA: Diagnosis not present

## 2021-07-25 DIAGNOSIS — Z124 Encounter for screening for malignant neoplasm of cervix: Secondary | ICD-10-CM | POA: Diagnosis not present

## 2021-07-25 DIAGNOSIS — Z6821 Body mass index (BMI) 21.0-21.9, adult: Secondary | ICD-10-CM | POA: Diagnosis not present

## 2021-07-26 ENCOUNTER — Encounter: Payer: Self-pay | Admitting: Gastroenterology

## 2021-07-28 DIAGNOSIS — M9901 Segmental and somatic dysfunction of cervical region: Secondary | ICD-10-CM | POA: Diagnosis not present

## 2021-07-28 DIAGNOSIS — M9905 Segmental and somatic dysfunction of pelvic region: Secondary | ICD-10-CM | POA: Diagnosis not present

## 2021-07-28 DIAGNOSIS — M9903 Segmental and somatic dysfunction of lumbar region: Secondary | ICD-10-CM | POA: Diagnosis not present

## 2021-07-28 DIAGNOSIS — M5451 Vertebrogenic low back pain: Secondary | ICD-10-CM | POA: Diagnosis not present

## 2021-07-28 DIAGNOSIS — M9902 Segmental and somatic dysfunction of thoracic region: Secondary | ICD-10-CM | POA: Diagnosis not present

## 2021-08-02 ENCOUNTER — Encounter: Payer: BC Managed Care – PPO | Admitting: Gastroenterology

## 2021-08-02 DIAGNOSIS — M7989 Other specified soft tissue disorders: Secondary | ICD-10-CM | POA: Diagnosis not present

## 2021-08-02 DIAGNOSIS — R768 Other specified abnormal immunological findings in serum: Secondary | ICD-10-CM | POA: Diagnosis not present

## 2021-08-02 DIAGNOSIS — M3500 Sicca syndrome, unspecified: Secondary | ICD-10-CM | POA: Diagnosis not present

## 2021-08-02 DIAGNOSIS — N059 Unspecified nephritic syndrome with unspecified morphologic changes: Secondary | ICD-10-CM | POA: Diagnosis not present

## 2021-08-04 DIAGNOSIS — M9901 Segmental and somatic dysfunction of cervical region: Secondary | ICD-10-CM | POA: Diagnosis not present

## 2021-08-04 DIAGNOSIS — M5451 Vertebrogenic low back pain: Secondary | ICD-10-CM | POA: Diagnosis not present

## 2021-08-04 DIAGNOSIS — M9905 Segmental and somatic dysfunction of pelvic region: Secondary | ICD-10-CM | POA: Diagnosis not present

## 2021-08-04 DIAGNOSIS — M9903 Segmental and somatic dysfunction of lumbar region: Secondary | ICD-10-CM | POA: Diagnosis not present

## 2021-08-04 DIAGNOSIS — M9902 Segmental and somatic dysfunction of thoracic region: Secondary | ICD-10-CM | POA: Diagnosis not present

## 2021-08-16 DIAGNOSIS — C8510 Unspecified B-cell lymphoma, unspecified site: Secondary | ICD-10-CM | POA: Diagnosis not present

## 2021-08-16 DIAGNOSIS — M3500 Sicca syndrome, unspecified: Secondary | ICD-10-CM | POA: Diagnosis not present

## 2021-08-19 DIAGNOSIS — Z79899 Other long term (current) drug therapy: Secondary | ICD-10-CM | POA: Diagnosis not present

## 2021-08-19 DIAGNOSIS — D84821 Immunodeficiency due to drugs: Secondary | ICD-10-CM | POA: Diagnosis not present

## 2021-08-19 DIAGNOSIS — N055 Unspecified nephritic syndrome with diffuse mesangiocapillary glomerulonephritis: Secondary | ICD-10-CM | POA: Diagnosis not present

## 2021-08-19 DIAGNOSIS — C8510 Unspecified B-cell lymphoma, unspecified site: Secondary | ICD-10-CM | POA: Diagnosis not present

## 2021-09-30 DIAGNOSIS — R1032 Left lower quadrant pain: Secondary | ICD-10-CM | POA: Diagnosis not present

## 2021-10-28 DIAGNOSIS — M3130 Wegener's granulomatosis without renal involvement: Secondary | ICD-10-CM | POA: Diagnosis not present

## 2021-10-28 DIAGNOSIS — N059 Unspecified nephritic syndrome with unspecified morphologic changes: Secondary | ICD-10-CM | POA: Diagnosis not present

## 2021-10-28 DIAGNOSIS — M3500 Sicca syndrome, unspecified: Secondary | ICD-10-CM | POA: Diagnosis not present

## 2021-10-30 DIAGNOSIS — H1032 Unspecified acute conjunctivitis, left eye: Secondary | ICD-10-CM | POA: Diagnosis not present

## 2021-11-04 DIAGNOSIS — H2000 Unspecified acute and subacute iridocyclitis: Secondary | ICD-10-CM | POA: Diagnosis not present

## 2021-11-10 DIAGNOSIS — H2000 Unspecified acute and subacute iridocyclitis: Secondary | ICD-10-CM | POA: Diagnosis not present

## 2021-11-14 DIAGNOSIS — H2000 Unspecified acute and subacute iridocyclitis: Secondary | ICD-10-CM | POA: Diagnosis not present

## 2021-11-14 DIAGNOSIS — M3501 Sicca syndrome with keratoconjunctivitis: Secondary | ICD-10-CM | POA: Diagnosis not present

## 2021-11-17 DIAGNOSIS — M3501 Sicca syndrome with keratoconjunctivitis: Secondary | ICD-10-CM | POA: Diagnosis not present

## 2021-11-17 DIAGNOSIS — H2000 Unspecified acute and subacute iridocyclitis: Secondary | ICD-10-CM | POA: Diagnosis not present

## 2021-11-22 DIAGNOSIS — M3500 Sicca syndrome, unspecified: Secondary | ICD-10-CM | POA: Diagnosis not present

## 2021-11-24 DIAGNOSIS — H2000 Unspecified acute and subacute iridocyclitis: Secondary | ICD-10-CM | POA: Diagnosis not present

## 2021-11-24 DIAGNOSIS — M3501 Sicca syndrome with keratoconjunctivitis: Secondary | ICD-10-CM | POA: Diagnosis not present

## 2021-11-25 DIAGNOSIS — D84821 Immunodeficiency due to drugs: Secondary | ICD-10-CM | POA: Diagnosis not present

## 2021-11-25 DIAGNOSIS — C8510 Unspecified B-cell lymphoma, unspecified site: Secondary | ICD-10-CM | POA: Diagnosis not present

## 2021-11-25 DIAGNOSIS — Z79899 Other long term (current) drug therapy: Secondary | ICD-10-CM | POA: Diagnosis not present

## 2021-11-25 DIAGNOSIS — N055 Unspecified nephritic syndrome with diffuse mesangiocapillary glomerulonephritis: Secondary | ICD-10-CM | POA: Diagnosis not present

## 2021-11-28 DIAGNOSIS — N059 Unspecified nephritic syndrome with unspecified morphologic changes: Secondary | ICD-10-CM | POA: Diagnosis not present

## 2021-11-28 DIAGNOSIS — R768 Other specified abnormal immunological findings in serum: Secondary | ICD-10-CM | POA: Diagnosis not present

## 2021-11-28 DIAGNOSIS — M3501 Sicca syndrome with keratoconjunctivitis: Secondary | ICD-10-CM | POA: Diagnosis not present

## 2021-11-28 DIAGNOSIS — H209 Unspecified iridocyclitis: Secondary | ICD-10-CM | POA: Diagnosis not present

## 2021-11-28 DIAGNOSIS — M3500 Sicca syndrome, unspecified: Secondary | ICD-10-CM | POA: Diagnosis not present

## 2021-11-28 DIAGNOSIS — H10021 Other mucopurulent conjunctivitis, right eye: Secondary | ICD-10-CM | POA: Diagnosis not present

## 2021-11-30 DIAGNOSIS — H1045 Other chronic allergic conjunctivitis: Secondary | ICD-10-CM | POA: Diagnosis not present

## 2021-12-05 DIAGNOSIS — H1045 Other chronic allergic conjunctivitis: Secondary | ICD-10-CM | POA: Diagnosis not present

## 2021-12-05 DIAGNOSIS — H04123 Dry eye syndrome of bilateral lacrimal glands: Secondary | ICD-10-CM | POA: Diagnosis not present

## 2021-12-14 DIAGNOSIS — H04123 Dry eye syndrome of bilateral lacrimal glands: Secondary | ICD-10-CM | POA: Diagnosis not present

## 2021-12-14 DIAGNOSIS — H1045 Other chronic allergic conjunctivitis: Secondary | ICD-10-CM | POA: Diagnosis not present

## 2021-12-19 DIAGNOSIS — H04123 Dry eye syndrome of bilateral lacrimal glands: Secondary | ICD-10-CM | POA: Diagnosis not present

## 2021-12-19 DIAGNOSIS — H1045 Other chronic allergic conjunctivitis: Secondary | ICD-10-CM | POA: Diagnosis not present

## 2021-12-22 DIAGNOSIS — L249 Irritant contact dermatitis, unspecified cause: Secondary | ICD-10-CM | POA: Diagnosis not present

## 2021-12-26 NOTE — Progress Notes (Unsigned)
New Patient Note  RE: Mindy Fuller MRN: [id number] DOB: 1965-10-30 Date of Office Visit: 12/27/2021  Consult requested by: Lisabeth Pick, MD Primary care provider: Shon Baton, MD  Chief Complaint: No chief complaint on file.  History of Present Illness: I had the pleasure of seeing Mindy Fuller for initial evaluation at the [facility] on 12/26/2021. She is a 56 y.o. female, who is referred here by Shon Baton, MD for the evaluation of allergic conjunctivitis.  She reports symptoms of ***. Symptoms have been going on for *** years. The symptoms are present *** all year around with worsening in ***. Other triggers include exposure to ***. Anosmia: ***. Headache: ***. She has used *** with ***fair improvement in symptoms. Sinus infections: ***. Previous work up includes: ***. Previous ENT evaluation: ***. Previous sinus imaging: ***. History of nasal polyps: ***. Last eye exam: ***. History of reflux: ***.  Assessment and Plan: Mindy Fuller is a 56 y.o. female with: No problem-specific Assessment & Plan notes found for this encounter.  No follow-ups on file.  No orders of the defined types were placed in this encounter.  Lab Orders  No laboratory test(s) ordered today    Other allergy screening: Asthma: {Blank single:19197::"yes","no"} Rhino conjunctivitis: {Blank single:19197::"yes","no"} Food allergy: {Blank single:19197::"yes","no"} Medication allergy: {Blank single:19197::"yes","no"} Hymenoptera allergy: {Blank single:19197::"yes","no"} Urticaria: {Blank single:19197::"yes","no"} Eczema:{Blank single:19197::"yes","no"} History of recurrent infections suggestive of immunodeficency: {Blank single:19197::"yes","no"}  Diagnostics: Spirometry:  Tracings reviewed. Her effort: {Blank single:19197::"Good reproducible efforts.","It was hard to get consistent efforts and there is a question as to whether this reflects a maximal maneuver.","Poor effort, data  can not be interpreted."} FVC: ***L FEV1: ***L, ***% predicted FEV1/FVC ratio: ***% Interpretation: {Blank single:19197::"Spirometry consistent with mild obstructive disease","Spirometry consistent with moderate obstructive disease","Spirometry consistent with severe obstructive disease","Spirometry consistent with possible restrictive disease","Spirometry consistent with mixed obstructive and restrictive disease","Spirometry uninterpretable due to technique","Spirometry consistent with normal pattern","No overt abnormalities noted given today's efforts"}.  Please see scanned spirometry results for details.  Skin Testing: {Blank single:19197::"Select foods","Environmental allergy panel","Environmental allergy panel and select foods","Food allergy panel","None","Deferred due to recent antihistamines use"}. *** Results discussed with patient/family.   Past Medical History: There are no problems to display for this patient.  Past Medical History:  Diagnosis Date  . Acute glomerulonephritis    s/p COVID 2019  . Anxiety   . Blood transfusion without reported diagnosis   . Constipation   . COVID 2019  . 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 Gastritis   . GERD (gastroesophageal reflux disease)   . Low libido   . Sjogren's syndrome ([place])   . Tension   . Vitamin D deficiency   . Vocal cord nodules    Past Surgical History: Past Surgical History:  Procedure Laterality Date  . BREAST CYST ASPIRATION    . LUMBAR FUSION     L5-S1  . ORIF METATARSAL FRACTURE Left   . PAROTIDECTOMY Right   . RENAL BIOPSY    . TONSILLECTOMY     Medication List:  Current Outpatient Medications  Medication Sig Dispense Refill  . acetaminophen (TYLENOL) 500 MG tablet Take 1 tablet by mouth as needed.    . ALPRAZolam (XANAX) 0.25 MG tablet Take 0.25 mg by mouth as needed.    . calcium carbonate (TUMS EX) 750 MG chewable tablet Chew 1 tablet by mouth daily.    . cetirizine (ZYRTEC) 10 MG  tablet Take 10 mg by mouth daily.    . Lactobacillus  Rhamnosus, GG, (RA PROBIOTIC DIGESTIVE CARE) CAPS Take 1 capsule by mouth daily.    . pantoprazole (PROTONIX) 40 MG tablet Take 40 mg by mouth 2 (two) times daily.    . riTUXimab (RITUXAN) 100 MG/10ML injection See admin instructions. Every 6 months    . traZODone (DESYREL) 100 MG tablet Take 1 tablet by mouth at bedtime.    . triamcinolone (NASACORT) 55 MCG/ACT AERO nasal inhaler Place 2 sprays into the nose daily.     Current Facility-Administered Medications  Medication Dose Route Frequency Provider Last Rate Last Admin  . albuterol (VENTOLIN HFA) 108 (90 Base) MCG/ACT inhaler 2 puff  2 puff Inhalation Once PRN Causey, Charlestine Massed, NP      . methylPREDNISolone sodium succinate (SOLU-MEDROL) 125 mg/2 mL injection 125 mg  125 mg Intramuscular Once PRN Causey, Charlestine Massed, NP       Allergies: Allergies  Allergen Reactions  . Clarithromycin Nausea And Vomiting and Nausea Only    Other reaction(s): Other (See Comments) Anxiety Anxiety   . Amoxicillin Nausea Only  . Ciprofloxacin Nausea And Vomiting  . Hydrocodone-Acetaminophen Other (See Comments)    Biliary Colic, can take medication if taking a PPI with it    Social History: Social History   Socioeconomic History  . Marital status: Married    Spouse name: Not on file  . Number of children: 2  . Years of education: Not on file  . Highest education level: Not on file  Occupational History  . Occupation: Self Employed  Tobacco Use  . Smoking status: Never    Passive exposure: Never  . Smokeless tobacco: Never  Vaping Use  . Vaping Use: Never used  Substance and Sexual Activity  . Alcohol use: Yes    Comment: once a month  . Drug use: Never  . Sexual activity: Not on file  Other Topics Concern  . Not on file  Social History Narrative  . Not on file   Social Determinants of Health   Financial Resource Strain: Not on file  Food Insecurity: Not on file   Transportation Needs: Not on file  Physical Activity: Not on file  Stress: Not on file  Social Connections: Not on file   Lives in a ***. Smoking: *** Occupation: ***  Environmental HistoryFreight forwarder in the house: Estate agent in the family room: {Blank single:19197::"yes","no"} Carpet in the bedroom: {Blank single:19197::"yes","no"} Heating: {Blank single:19197::"electric","gas","heat pump"} Cooling: {Blank single:19197::"central","window","heat pump"} Pet: {Blank single:19197::"yes ***","no"}  Family History: Family History  Problem Relation Age of Onset  . Hypertension Mother   . Other Mother        pre-diabetes  . Coronary artery disease Father   . Diabetes Mellitus I Father   . Atrial fibrillation Father   . Colon cancer Neg Hx   . Esophageal cancer Neg Hx   . Stomach cancer Neg Hx   . Rectal cancer Neg Hx    Problem                               Relation Asthma                                   *** Eczema                                ***  Food allergy                          *** Allergic rhino conjunctivitis     ***  Review of Systems  Constitutional:  Negative for appetite change, chills, fever and unexpected weight change.  HENT:  Negative for congestion and rhinorrhea.   Eyes:  Negative for itching.  Respiratory:  Negative for cough, chest tightness, shortness of breath and wheezing.   Cardiovascular:  Negative for chest pain.  Gastrointestinal:  Negative for abdominal pain.  Genitourinary:  Negative for difficulty urinating.  Skin:  Negative for rash.  Neurological:  Negative for headaches.   Objective: There were no vitals taken for this visit. There is no height or weight on file to calculate BMI. Physical Exam Vitals and nursing note reviewed.  Constitutional:      Appearance: Normal appearance. She is well-developed.  HENT:     Head: Normocephalic and atraumatic.     Right Ear: Tympanic membrane and  external ear normal.     Left Ear: Tympanic membrane and external ear normal.     Nose: Nose normal.     Mouth/Throat:     Mouth: Mucous membranes are moist.     Pharynx: Oropharynx is clear.  Eyes:     Conjunctiva/sclera: Conjunctivae normal.  Cardiovascular:     Rate and Rhythm: Normal rate and regular rhythm.     Heart sounds: Normal heart sounds. No murmur heard.    No friction rub. No gallop.  Pulmonary:     Effort: Pulmonary effort is normal.     Breath sounds: Normal breath sounds. No wheezing, rhonchi or rales.  Musculoskeletal:     Cervical back: Neck supple.  Skin:    General: Skin is warm.     Findings: No rash.  Neurological:     Mental Status: She is alert and oriented to person, place, and time.  Psychiatric:        Behavior: Behavior normal.  The plan was reviewed with the patient/family, and all questions/concerned were addressed.  It was my pleasure to see Mindy Fuller today and participate in her care. Please feel free to contact me with any questions or concerns.  Sincerely,  Rexene Alberts, DO Allergy & Immunology  [facility] office: [place] office: [phone]

## 2021-12-27 ENCOUNTER — Encounter: Payer: Self-pay | Admitting: Allergy

## 2021-12-27 ENCOUNTER — Ambulatory Visit (INDEPENDENT_AMBULATORY_CARE_PROVIDER_SITE_OTHER): Payer: BC Managed Care – PPO | Admitting: Allergy

## 2021-12-27 VITALS — BP 100/64 | HR 73 | Temp 98.4°F | Ht 65.5 in | Wt 127.8 lb

## 2021-12-27 DIAGNOSIS — J310 Chronic rhinitis: Secondary | ICD-10-CM | POA: Diagnosis not present

## 2021-12-27 DIAGNOSIS — H1089 Other conjunctivitis: Secondary | ICD-10-CM | POA: Diagnosis not present

## 2021-12-27 NOTE — Assessment & Plan Note (Signed)
Bilateral conjunctivitis x 9 weeks. Tried the following eye drops: tobramycin, prednisolone and cromolyn. Treated with systemic steroids as well. Finally eyes seem to be back to baseline. Concerned about allergic trigger. Evaluated by ophthalmology/dermatology.  Today's skin testing showed: Negative to indoor/outdoor allergens. . Not sure what triggered this episode. No skin testing available for gun powder materials, tennis balls - as patient was at a gun range the day before onset and plays tennis often.  . Recommend to check with ophthalmology regarding weaning off the eye drops.  o If no issues then okay to start wearing contact lens again. o Recommend limiting make up use until able to wean off eye drops completely.  . If symptoms reoccur then consider patch testing next.  . Try not to touch eyes/eyeballs.  . See below for proper skin care.

## 2021-12-27 NOTE — Patient Instructions (Addendum)
Today's skin testing showed: Negative to indoor/outdoor allergens.  Results given.  Rhinitis:  Recommend ENT evaluation next for persistent nasal congestion. Use Flonase (fluticasone) or Nasacort nasal spray 1 spray per nostril twice a day as needed for nasal congestion.  Limit afrin use - as you can get build a tolerance to it. Use over the counter antihistamines such as Zyrtec (cetirizine), Claritin (loratadine), Allegra (fexofenadine), or Xyzal (levocetirizine) daily as needed. May take twice a day during allergy flares. May switch antihistamines every few months. Nasal saline spray (i.e., Simply Saline) or nasal saline lavage (i.e., NeilMed) is recommended as needed and prior to medicated nasal sprays.  For the eyes: Not sure what triggered your episode. Check with your eye doctor if it's okay to wean off the eye drops.  Consider patch testing as below. Try not to touch eyes/eyeballs.  See below for proper skin care.   Patch information:  Patches are best placed on Monday with return to office on Wednesday and Friday of same week for readings.  Patches once placed should not get wet.  You do not have to stop any medications for patch testing but should not be on oral prednisone. You can schedule a patch testing visit when convenient for your schedule.    True Test looks for the following sensitivities:        Follow up if needed.  Skin care recommendations  Bath time: Always use lukewarm water. AVOID very hot or cold water. Keep bathing time to 5-10 minutes. Do NOT use bubble bath. Use a mild soap and use just enough to wash the dirty areas. Do NOT scrub skin vigorously.  After bathing, pat dry your skin with a towel. Do NOT rub or scrub the skin.  Moisturizers and prescriptions:  ALWAYS apply moisturizers immediately after bathing (within 3 minutes). This helps to lock-in moisture. Use the moisturizer several times a day over the whole body. Good summer moisturizers  include: Aveeno, CeraVe, Cetaphil. Good winter moisturizers include: Aquaphor, Vaseline, Cerave, Cetaphil, Eucerin, Vanicream. When using moisturizers along with medications, the moisturizer should be applied about one hour after applying the medication to prevent diluting effect of the medication or moisturize around where you applied the medications. When not using medications, the moisturizer can be continued twice daily as maintenance.  Laundry and clothing: Avoid laundry products with added color or perfumes. Use unscented hypo-allergenic laundry products such as Tide free, Cheer free & gentle, and All free and clear.  If the skin still seems dry or sensitive, you can try double-rinsing the clothes. Avoid tight or scratchy clothing such as wool. Do not use fabric softeners or dyer sheets.

## 2021-12-27 NOTE — Assessment & Plan Note (Signed)
Worsening nasal congestion x 2 years since moved to [place] mainly in the fall. Uses Afrin and Nasacort with good benefit.   Today's skin testing showed: Negative to indoor/outdoor allergens.  Recommend ENT evaluation next for persistent nasal congestion.  Use Flonase (fluticasone) or Nasacort nasal spray 1 spray per nostril twice a day as needed for nasal congestion.   Limit afrin use - as you can get build a tolerance to it.  Use over the counter antihistamines such as Zyrtec (cetirizine), Claritin (loratadine), Allegra (fexofenadine), or Xyzal (levocetirizine) daily as needed. May take twice a day during allergy flares. May switch antihistamines every few months.  Nasal saline spray (i.e., Simply Saline) or nasal saline lavage (i.e., NeilMed) is recommended as needed and prior to medicated nasal sprays.

## 2021-12-30 ENCOUNTER — Telehealth: Payer: Self-pay | Admitting: Allergy

## 2021-12-30 DIAGNOSIS — J3089 Other allergic rhinitis: Secondary | ICD-10-CM

## 2021-12-30 NOTE — Telephone Encounter (Signed)
Patient states where the testing was done on her arm, it has started to swell and it has been very itchy. Patient states it is like mosquito bites and would like to speak to someone in regards to this.  Best contact number; [phone]

## 2021-12-30 NOTE — Telephone Encounter (Signed)
LVM for patient to call the office back to go over her concerns.

## 2021-12-30 NOTE — Telephone Encounter (Signed)
Called and spoke with patient and she is more concerned about the fax that she had a delayed reaction after the testing 48 hours later. She is also wondering that since she is negative to everything then why does she need to be on antihistamines and why does it make her feel better if she is not allergic to anything. She is going to e-mail pictures of the reaction to our Allergy and Asthma e-mail. I advised that I would forward this to Dr. Maudie Mercury and she would review this when she is back in office on Tuesday, patient was fine with this and verbalized understanding.

## 2021-12-30 NOTE — Telephone Encounter (Signed)
Can you please have her double up on her cetirizine for the next 2 days. Please have her take pictures. Please have her call back if the area worsens. Thank you

## 2021-12-30 NOTE — Telephone Encounter (Signed)
Spoke to patient about her concerns. Patient states that her arm is now swollen and itchy 48 hrs after the test. Advised patient that the bumps on her arm are suppose to look like that because of placing the allergens right under the skin and that it could take up to a week to come down. Also advised the patient to take her allergy medication and ice the affected area for 20 minutes at a time up to an hour with a barrier in between. Patient voiced concern about the reaction happening 48 hrs later and if one of her medications from another doctor is an immunosuppressant.

## 2022-01-02 NOTE — Telephone Encounter (Signed)
Patient would like to get the bloodwork for environmental allergy's done. She is fine with coming into the [place] office to get the labs drawn. she verbalized understanding that she can take her allergy medication to alleviate the redness and bumps that appeared. She took pictures and sent them to [facility] for Dr.Kim to review.    I informed her we would give her a call once the requisition is ready.

## 2022-01-02 NOTE — Telephone Encounter (Signed)
Please call patient.  Delayed skin test reactions are not useful in clinical practice.  Some people have entopic allergy which means that testing is all negative but still have symptoms which improve with antihistamines.  If you feel better taking antihistamines then okay to continue to do so.   Sometimes it also takes a few seasons of pollen exposure to turn positive on skin testing.  There is bloodwork for environmental allergy panel available for the [place] area which we can order if interested.

## 2022-01-03 DIAGNOSIS — F4322 Adjustment disorder with anxiety: Secondary | ICD-10-CM | POA: Diagnosis not present

## 2022-01-03 DIAGNOSIS — J3089 Other allergic rhinitis: Secondary | ICD-10-CM | POA: Diagnosis not present

## 2022-01-03 NOTE — Telephone Encounter (Signed)
Called patient and informed of Dr. Julianne Rice note. Patient said she would stop by the [place] office to get her bloodwork done.

## 2022-01-03 NOTE — Telephone Encounter (Signed)
Please call patient and let her know that the bloodwork is in.  If she comes to our [place] office, she can get her labs drawn OR we can mail her the order and she can get it drawn at an [place] facility.

## 2022-01-03 NOTE — Addendum Note (Signed)
Addended by: Garnet Sierras on: 01/03/2022 08:48 AM   Modules accepted: Orders

## 2022-01-05 DIAGNOSIS — H04123 Dry eye syndrome of bilateral lacrimal glands: Secondary | ICD-10-CM | POA: Diagnosis not present

## 2022-01-05 DIAGNOSIS — H1045 Other chronic allergic conjunctivitis: Secondary | ICD-10-CM | POA: Diagnosis not present

## 2022-01-05 LAB — ALLERGENS W/TOTAL IGE AREA 2
Alternaria Alternata IgE: <0.1 kU/L
Aspergillus Fumigatus IgE: <0.1 kU/L
Bermuda Grass IgE: <0.1 kU/L
Cat Dander IgE: <0.1 kU/L
Cedar, Mountain IgE: <0.1 kU/L
Cladosporium Herbarum IgE: <0.1 kU/L
Cockroach, German IgE: <0.1 kU/L
Common Silver Birch IgE: <0.1 kU/L
Cottonwood IgE: <0.1 kU/L
D Farinae IgE: <0.1 kU/L
D Pteronyssinus IgE: <0.1 kU/L
Dog Dander IgE: <0.1 kU/L
Elm, American IgE: <0.1 kU/L
IgE (Immunoglobulin E), Serum: <2 IU/mL — ABNORMAL LOW (ref 6–495)
Johnson Grass IgE: <0.1 kU/L
Maple/Box Elder IgE: <0.1 kU/L
Mouse Urine IgE: <0.1 kU/L
Oak, White IgE: <0.1 kU/L
Pecan, Hickory IgE: <0.1 kU/L
Penicillium Chrysogen IgE: <0.1 kU/L
Pigweed, Rough IgE: <0.1 kU/L
Ragweed, Short IgE: <0.1 kU/L
Sheep Sorrel IgE Qn: <0.1 kU/L
Timothy Grass IgE: <0.1 kU/L
White Mulberry IgE: <0.1 kU/L

## 2022-01-17 DIAGNOSIS — M5442 Lumbago with sciatica, left side: Secondary | ICD-10-CM | POA: Diagnosis not present

## 2022-01-24 DIAGNOSIS — H10022 Other mucopurulent conjunctivitis, left eye: Secondary | ICD-10-CM | POA: Diagnosis not present

## 2022-01-24 DIAGNOSIS — H1045 Other chronic allergic conjunctivitis: Secondary | ICD-10-CM | POA: Diagnosis not present

## 2022-02-01 DIAGNOSIS — H04123 Dry eye syndrome of bilateral lacrimal glands: Secondary | ICD-10-CM | POA: Diagnosis not present

## 2022-02-01 DIAGNOSIS — M3501 Sicca syndrome with keratoconjunctivitis: Secondary | ICD-10-CM | POA: Diagnosis not present

## 2022-02-08 DIAGNOSIS — M3500 Sicca syndrome, unspecified: Secondary | ICD-10-CM | POA: Diagnosis not present

## 2022-02-08 DIAGNOSIS — N059 Unspecified nephritic syndrome with unspecified morphologic changes: Secondary | ICD-10-CM | POA: Diagnosis not present

## 2022-02-08 DIAGNOSIS — R768 Other specified abnormal immunological findings in serum: Secondary | ICD-10-CM | POA: Diagnosis not present

## 2022-02-08 DIAGNOSIS — H1089 Other conjunctivitis: Secondary | ICD-10-CM | POA: Diagnosis not present

## 2022-02-09 ENCOUNTER — Other Ambulatory Visit: Payer: Self-pay

## 2022-02-09 ENCOUNTER — Emergency Department (HOSPITAL_COMMUNITY)
Admission: EM | Admit: 2022-02-09 | Discharge: 2022-02-09 | Disposition: A | Discharge status: Home / Self Care | Payer: BC Managed Care – PPO | Attending: Emergency Medicine | Admitting: Emergency Medicine

## 2022-02-09 DIAGNOSIS — T1592XA Foreign body on external eye, part unspecified, left eye, initial encounter: Secondary | ICD-10-CM

## 2022-02-09 DIAGNOSIS — X58XXXA Exposure to other specified factors, initial encounter: Secondary | ICD-10-CM | POA: Diagnosis not present

## 2022-02-09 DIAGNOSIS — T1502XA Foreign body in cornea, left eye, initial encounter: Secondary | ICD-10-CM | POA: Diagnosis not present

## 2022-02-09 MED ORDER — FLUORESCEIN SODIUM 1 MG OP STRP
1.0000 | ORAL_STRIP | Freq: Once | OPHTHALMIC | Status: AC
  Administered 2022-02-09: 1 via OPHTHALMIC
  Filled 2022-02-09: qty 1

## 2022-02-09 MED ORDER — TETRACAINE HCL 0.5 % OP SOLN
2.0000 [drp] | Freq: Once | OPHTHALMIC | Status: AC
  Administered 2022-02-09: 2 [drp] via OPHTHALMIC
  Filled 2022-02-09: qty 4

## 2022-02-09 NOTE — ED Notes (Signed)
Patient ambulatory to restroom with steady gait.

## 2022-02-09 NOTE — ED Provider Notes (Signed)
[facility]GENC[facility] DEPARTMENT Provider Note   CSN: [id number] Arrival date & time: 02/09/22  2001     History  Chief Complaint  Patient presents with   Foreign Body in [place] is a 56 y.o. female.  With past medical history significant for Sjogren's presenting with concerns for sensation of foreign body in her left eye.  She reports that she has had intermittent flares in her eyes due to her Sjogren's recently.  She states that approximately 2 days ago she had sensation that she had a hair in her eye.  She reports that she still has this sensation.  She will intermittently have relief from the sensation.  She additionally notes that this sensation is not in the same place every time, but that she can feel this hair like sensation moved to different areas within her eye, especially in the upper part of her eye ball.  She states that she feels this is caused some flare of her Sjogren's in this eye.  She endorses some blurred vision that she attributes to a film that resolves when she puts eyedrops in her eye.  No pain, no fever, no new or different discharge.  She has attempted to irrigate this out with saline and drops and has not been successful at this time.  HPI     Home Medications Prior to Admission medications   Medication Sig Start Date End Date Taking? Authorizing Provider  acetaminophen (TYLENOL) 500 MG tablet Take 1 tablet by mouth as needed. 04/20/19   [provider]  ALPRAZolam Duanne Moron) 0.25 MG tablet Take 0.25 mg by mouth as needed. 09/26/19   [provider]  cetirizine (ZYRTEC) 10 MG tablet Take 10 mg by mouth daily.    [provider]  COMBIPATCH 0.05-0.14 MG/DAY 1 patch 2 (two) times a week. 12/21/21   [provider]  cromolyn (OPTICROM) 4 % ophthalmic solution 1 drop 4 (four) times daily. 12/22/21   [provider]  estradiol-levonorgestrel (CLIMARA PRO) 0.045-0.015 MG/DAY Apply 1 patch every  week by transdermal route. 10/06/21   [provider]  prednisoLONE acetate (PRED FORTE) 1 % ophthalmic suspension 1 drop 4 (four) times daily. 11/30/21   [provider]  riTUXimab (RITUXAN) 100 MG/10ML injection See admin instructions. Every 6 months    [provider]  traZODone (DESYREL) 100 MG tablet Take 1 tablet by mouth at bedtime.    [provider]  tretinoin (RETIN-A) 0.1 % cream APPLY DIME SIZED AMOUNT TO ENTIRE FACE NIGHTLY AS TOLERATED    [provider]  triamcinolone (NASACORT) 55 MCG/ACT AERO nasal inhaler Place 2 sprays into the nose daily.    [provider]  TYRVAYA 0.03 MG/ACT SOLN Place into both nostrils. 12/26/21   [provider]      Allergies    Clarithromycin, Amoxicillin, Ciprofloxacin, and Hydrocodone-acetaminophen    Review of Systems   Review of Systems  Physical Exam Updated Vital Signs BP 130/80   Pulse 81   Temp 98.2 F (36.8 C)   Resp 18   SpO2 100%  Physical Exam Vitals and nursing note reviewed.  Constitutional:      General: She is not in acute distress.    Appearance: She is well-developed.  HENT:     Head: Normocephalic and atraumatic.  Eyes:     General: Vision grossly intact. Gaze aligned appropriately. No scleral icterus.       Left eye: Foreign body (small hair  visualized in upper eye - removed) present.    Extraocular Movements:     Right eye: Normal extraocular motion.     Left eye: Normal extraocular motion.     Conjunctiva/sclera:     Right eye: Right conjunctiva is not injected.     Left eye: Left conjunctiva is injected.     Pupils: Pupils are equal, round, and reactive to light.     Slit lamp exam:    Left eye: No corneal ulcer.     Comments: No abrasions or ulcers noted on exam following fluorescein stain  Cardiovascular:     Rate and Rhythm: Normal rate and regular rhythm.     Heart sounds: No murmur heard. Pulmonary:     Effort: Pulmonary effort is normal.  No respiratory distress.  Abdominal:     Palpations: Abdomen is soft.  Musculoskeletal:     Cervical back: Neck supple.  Skin:    General: Skin is warm and dry.     Capillary Refill: Capillary refill takes less than 2 seconds.  Neurological:     Mental Status: She is alert.  Psychiatric:        Mood and Affect: Mood normal.     ED Results / Procedures / Treatments   Labs (all labs ordered are listed, but only abnormal results are displayed) Labs Reviewed - No data to display  EKG None  Radiology No results found.  Procedures Procedures    Medications Ordered in ED Medications  tetracaine (PONTOCAINE) 0.5 % ophthalmic solution 2 drop (2 drops Left Eye Given 02/09/22 2229)  fluorescein ophthalmic strip 1 strip (1 strip Left Eye Given 02/09/22 2229)    ED Course/ Medical Decision Making/ A&P                           Medical Decision Making Risk Prescription drug management.   Patient's presentation is most concerning for foreign body in left eye.  She may have secondary Sjogren's flare as response to this foreign body.  I did remove a small hair-like structure from the patient's eye.  She may have some irritation and continued foreign body sensation.  I did not appreciate any abrasions or ulcers on staining.  Patient's visual acuity is intact.  She is established with an ophthalmologist and has follow-up appointment scheduled soon, but she will call to see if she can move this up as soon as they are open after the holidays.  She will continue to use her prescribed eyedrops.  Strict return precautions given including any changes in visual acuity, significant increase in pain, any other concerns for worsened flare.  She and her husband expressed understanding.        Final Clinical Impression(s) / ED Diagnoses Final diagnoses:  Foreign body of left eye, initial encounter    Rx / DC Orders ED Discharge Orders     None         Luster Landsberg, MD 02/10/22  Leandrew Koyanagi    Noemi Chapel, MD 02/11/22 1010

## 2022-02-09 NOTE — ED Triage Notes (Signed)
Pt presents with report of hair in her left eye for 2 days.  Reports multiple eye issues in the past and has been seen by multiple eye specialists.  This however is a new problem and has not been able to flush the hair out of her eye with saline.

## 2022-02-09 NOTE — Discharge Instructions (Addendum)
Please continue using your eyedrops as you have been prescribed by your ophthalmologist.  If you have vision changes, significant pain, fever, significant increase in discharge from your eye that is different from usual, please return to the emergency department.  Will be very important for you to follow-up with your ophthalmologist.  Please follow-up with your ophthalmologist as soon as they are able to see you early next week.

## 2022-02-12 DIAGNOSIS — K1120 Sialoadenitis, unspecified: Secondary | ICD-10-CM | POA: Diagnosis not present

## 2022-02-22 DIAGNOSIS — H04123 Dry eye syndrome of bilateral lacrimal glands: Secondary | ICD-10-CM | POA: Diagnosis not present

## 2022-02-22 DIAGNOSIS — M3501 Sicca syndrome with keratoconjunctivitis: Secondary | ICD-10-CM | POA: Diagnosis not present

## 2022-04-07 DIAGNOSIS — M3500 Sicca syndrome, unspecified: Secondary | ICD-10-CM | POA: Diagnosis not present

## 2022-04-07 DIAGNOSIS — Z1231 Encounter for screening mammogram for malignant neoplasm of breast: Secondary | ICD-10-CM | POA: Diagnosis not present

## 2022-04-07 DIAGNOSIS — D472 Monoclonal gammopathy: Secondary | ICD-10-CM | POA: Diagnosis not present

## 2022-04-12 DIAGNOSIS — R42 Dizziness and giddiness: Secondary | ICD-10-CM | POA: Diagnosis not present

## 2022-04-12 DIAGNOSIS — N055 Unspecified nephritic syndrome with diffuse mesangiocapillary glomerulonephritis: Secondary | ICD-10-CM | POA: Diagnosis not present

## 2022-04-12 DIAGNOSIS — H209 Unspecified iridocyclitis: Secondary | ICD-10-CM | POA: Diagnosis not present

## 2022-04-12 DIAGNOSIS — M3500 Sicca syndrome, unspecified: Secondary | ICD-10-CM | POA: Diagnosis not present

## 2022-05-01 DIAGNOSIS — L989 Disorder of the skin and subcutaneous tissue, unspecified: Secondary | ICD-10-CM | POA: Diagnosis not present

## 2022-05-01 DIAGNOSIS — M542 Cervicalgia: Secondary | ICD-10-CM | POA: Diagnosis not present

## 2022-05-02 DIAGNOSIS — N059 Unspecified nephritic syndrome with unspecified morphologic changes: Secondary | ICD-10-CM | POA: Diagnosis not present

## 2022-05-02 DIAGNOSIS — M3130 Wegener's granulomatosis without renal involvement: Secondary | ICD-10-CM | POA: Diagnosis not present

## 2022-05-02 DIAGNOSIS — M3500 Sicca syndrome, unspecified: Secondary | ICD-10-CM | POA: Diagnosis not present

## 2022-05-03 DIAGNOSIS — H04123 Dry eye syndrome of bilateral lacrimal glands: Secondary | ICD-10-CM | POA: Diagnosis not present

## 2022-05-03 DIAGNOSIS — M3501 Sicca syndrome with keratoconjunctivitis: Secondary | ICD-10-CM | POA: Diagnosis not present

## 2022-05-12 ENCOUNTER — Other Ambulatory Visit: Payer: Self-pay

## 2022-05-12 DIAGNOSIS — C8580 Other specified types of non-Hodgkin lymphoma, unspecified site: Secondary | ICD-10-CM

## 2022-05-16 ENCOUNTER — Inpatient Hospital Stay: Payer: BC Managed Care – PPO | Attending: Hematology | Admitting: Hematology

## 2022-05-16 ENCOUNTER — Inpatient Hospital Stay: Payer: BC Managed Care – PPO

## 2022-05-16 ENCOUNTER — Other Ambulatory Visit: Payer: Self-pay

## 2022-05-16 VITALS — BP 116/73 | HR 80 | Temp 98.2°F | Resp 16 | Ht 65.5 in | Wt 128.5 lb

## 2022-05-16 DIAGNOSIS — N049 Nephrotic syndrome with unspecified morphologic changes: Secondary | ICD-10-CM | POA: Insufficient documentation

## 2022-05-16 DIAGNOSIS — M3500 Sicca syndrome, unspecified: Secondary | ICD-10-CM | POA: Diagnosis not present

## 2022-05-16 DIAGNOSIS — Z881 Allergy status to other antibiotic agents status: Secondary | ICD-10-CM | POA: Diagnosis not present

## 2022-05-16 DIAGNOSIS — C8580 Other specified types of non-Hodgkin lymphoma, unspecified site: Secondary | ICD-10-CM | POA: Diagnosis not present

## 2022-05-16 DIAGNOSIS — Z836 Family history of other diseases of the respiratory system: Secondary | ICD-10-CM | POA: Diagnosis not present

## 2022-05-16 DIAGNOSIS — R519 Headache, unspecified: Secondary | ICD-10-CM | POA: Insufficient documentation

## 2022-05-16 DIAGNOSIS — Z8616 Personal history of COVID-19: Secondary | ICD-10-CM | POA: Diagnosis not present

## 2022-05-16 DIAGNOSIS — Z880 Allergy status to penicillin: Secondary | ICD-10-CM | POA: Diagnosis not present

## 2022-05-16 DIAGNOSIS — Z8719 Personal history of other diseases of the digestive system: Secondary | ICD-10-CM | POA: Insufficient documentation

## 2022-05-16 DIAGNOSIS — Z8249 Family history of ischemic heart disease and other diseases of the circulatory system: Secondary | ICD-10-CM | POA: Diagnosis not present

## 2022-05-16 DIAGNOSIS — Z79899 Other long term (current) drug therapy: Secondary | ICD-10-CM | POA: Diagnosis not present

## 2022-05-16 DIAGNOSIS — Z833 Family history of diabetes mellitus: Secondary | ICD-10-CM | POA: Diagnosis not present

## 2022-05-16 DIAGNOSIS — Z885 Allergy status to narcotic agent status: Secondary | ICD-10-CM | POA: Diagnosis not present

## 2022-05-16 DIAGNOSIS — C8309 Small cell B-cell lymphoma, extranodal and solid organ sites: Secondary | ICD-10-CM | POA: Insufficient documentation

## 2022-05-16 LAB — CBC WITH DIFFERENTIAL (CANCER CENTER ONLY)
Abs Immature Granulocytes: 0.02 10*3/uL (ref 0.00–0.07)
Basophils Absolute: 0 10*3/uL (ref 0.0–0.1)
Basophils Relative: 0 %
Eosinophils Absolute: 0 10*3/uL (ref 0.0–0.5)
Eosinophils Relative: 0 %
HCT: 40.6 % (ref 36.0–46.0)
Hemoglobin: 13.6 g/dL (ref 12.0–15.0)
Immature Granulocytes: 0 %
Lymphocytes Relative: 10 %
Lymphs Abs: 0.9 10*3/uL (ref 0.7–4.0)
MCH: 30.7 pg (ref 26.0–34.0)
MCHC: 33.5 g/dL (ref 30.0–36.0)
MCV: 91.6 fL (ref 80.0–100.0)
Monocytes Absolute: 0.2 10*3/uL (ref 0.1–1.0)
Monocytes Relative: 2 %
Neutro Abs: 7.7 10*3/uL (ref 1.7–7.7)
Neutrophils Relative %: 88 %
Platelet Count: 223 10*3/uL (ref 150–400)
RBC: 4.43 MIL/uL (ref 3.87–5.11)
RDW: 12.1 % (ref 11.5–15.5)
WBC Count: 8.9 10*3/uL (ref 4.0–10.5)
nRBC: 0 % (ref 0.0–0.2)

## 2022-05-16 LAB — CMP (CANCER CENTER ONLY)
ALT: 16 U/L (ref 0–44)
AST: 20 U/L (ref 15–41)
Albumin: 4.5 g/dL (ref 3.5–5.0)
Alkaline Phosphatase: 44 U/L (ref 38–126)
Anion gap: 5 (ref 5–15)
BUN: 20 mg/dL (ref 6–20)
CO2: 31 mmol/L (ref 22–32)
Calcium: 9.2 mg/dL (ref 8.9–10.3)
Chloride: 101 mmol/L (ref 98–111)
Creatinine: 0.84 mg/dL (ref 0.44–1.00)
GFR, Estimated: >60 mL/min (ref >60)
Glucose, Bld: 132 mg/dL — ABNORMAL HIGH (ref 70–99)
Potassium: 4.3 mmol/L (ref 3.5–5.1)
Sodium: 137 mmol/L (ref 135–145)
Total Bilirubin: 0.3 mg/dL (ref 0.3–1.2)
Total Protein: 6.9 g/dL (ref 6.5–8.1)

## 2022-05-16 LAB — LACTATE DEHYDROGENASE: LDH: 169 U/L (ref 98–192)

## 2022-05-16 NOTE — Progress Notes (Signed)
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 Patient has a history of Sjogren's syndrome diagnosed per lip biopsy about 14 years ago.  She has had chronic sicca syndrome.  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  This has been treated with Rituxan and she continues to be on maintenance Rituxan every 6 months as per her nephrologist.  INTERVAL HISTORY  Mindy Fuller is a 57 y/o female pt presenting with right facial nerve pain.   Patient was last seen by me on 05/03/2021 and she complained of nerve pain, insomnia, dry mouth, and congestion.   Patient reports she has been doing well overall since our last visit. She does complain of neck stiffness which causes her headaches. She is currently following up with her neurologist and her PCP. She is currently treated with Prednisone.   Patient is regularly following up with her Rheumatologist for her Sjogren's syndrome. She is currently getting treated with Rituxan. She notes that Rituxan has been helping her symptoms.   She denies fever, chills, night sweats, diarrhea, fatigue, abdominal pain, abnormal bowel movement, chest pain, back pain, or leg swelling. She does complain of small bony lesion near her neck.   She is complaint with all her medication.   Patient reports that she has had COVID-19  many times in 2022.   She has received influenza vaccine, but denies COVID-19 Booster.    MEDICAL HISTORY:  Past Medical History:  Diagnosis Date   Acute glomerulonephritis    s/p COVID 2019   Anxiety    Blood transfusion without reported diagnosis    Constipation    COVID 2019   Extranodal marginal zone B-cell lymphoma (HCC)    Gastritis    GERD (gastroesophageal reflux disease)    Low libido    Sjogren's syndrome (HCC)    Tension    Vitamin D deficiency    Vocal cord nodules   Follows with Dr. Renette Butters for her glomerulonephritis   SURGICAL HISTORY Right superficial parotidectomy for extranodal marginal zone lymphoma Kidney biopsy 03/2019 History of breast biopsy benign cyst aspirated   SOCIAL HISTORY: Social History   Socioeconomic History   Marital status: Married    Spouse name: Not on file   Number of children: 2   Years of education: Not on file   Highest education level: Not on file  Occupational History   Occupation: Self Employed  Tobacco Use   Smoking status: Never    Passive exposure: Never   Smokeless tobacco: Never  Vaping Use   Vaping Use: Never used  Substance and Sexual Activity   Alcohol use: Yes    Comment: once a month   Drug use: Never   Sexual activity: Not on file  Other Topics Concern   Not on file  Social History Narrative   Not on file   Social Determinants of Health   Financial Resource Strain: Not on  file  Food Insecurity: Not on file  Transportation Needs: Not on file  Physical Activity: Not on file  Stress: Not on file  Social Connections: Not on file  Intimate Partner Violence: Not on file  Non-smoker nondrinker. 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   FAMILY HISTORY: Family History  Problem Relation Age of Onset   Hypertension Mother    Other Mother        pre-diabetes   Allergic rhinitis Father    Coronary artery disease Father    Diabetes Mellitus I Father    Atrial fibrillation Father     Colon cancer Neg Hx    Esophageal cancer Neg Hx    Stomach cancer Neg Hx    Rectal cancer Neg Hx     ALLERGIES:  is allergic to clarithromycin, amoxicillin, ciprofloxacin, and hydrocodone-acetaminophen.  MEDICATIONS:  Current Outpatient Medications  Medication Sig Dispense Refill   acetaminophen (TYLENOL) 500 MG tablet Take 1 tablet by mouth as needed.     ALPRAZolam (XANAX) 0.25 MG tablet Take 0.25 mg by mouth as needed.     cetirizine (ZYRTEC) 10 MG tablet Take 10 mg by mouth daily.     COMBIPATCH 0.05-0.14 MG/DAY 1 patch 2 (two) times a week.     cromolyn (OPTICROM) 4 % ophthalmic solution 1 drop 4 (four) times daily.     estradiol-levonorgestrel (CLIMARA PRO) 0.045-0.015 MG/DAY Apply 1 patch every week by transdermal route.     prednisoLONE acetate (PRED FORTE) 1 % ophthalmic suspension 1 drop 4 (four) times daily.     riTUXimab (RITUXAN) 100 MG/10ML injection See admin instructions. Every 6 months     traZODone (DESYREL) 100 MG tablet Take 1 tablet by mouth at bedtime.     tretinoin (RETIN-A) 0.1 % cream APPLY DIME SIZED AMOUNT TO ENTIRE FACE NIGHTLY AS TOLERATED     triamcinolone (NASACORT) 55 MCG/ACT AERO nasal inhaler Place 2 sprays into the nose daily.     TYRVAYA 0.03 MG/ACT SOLN Place into both nostrils.     Current Facility-Administered Medications  Medication Dose Route Frequency Provider Last Rate Last Admin   albuterol (VENTOLIN HFA) 108 (90 Base) MCG/ACT inhaler 2 puff  2 puff Inhalation Once PRN Causey, Charlestine Massed, NP       methylPREDNISolone sodium succinate (SOLU-MEDROL) 125 mg/2 mL injection 125 mg  125 mg Intramuscular Once PRN Causey, Charlestine Massed, NP        REVIEW OF SYSTEMS:    No fevers no chills no night sweats no obvious new lumps or bumps. No new skin rashes.  PHYSICAL EXAMINATION: ECOG PERFORMANCE STATUS: 1 - Symptomatic but completely ambulatory  . Vitals:   05/16/22 1409  BP: 116/73  Pulse: 80  Resp: 16  Temp: 98.2 F (36.8 C)   SpO2: 100%   Filed Weights   05/16/22 1409  Weight: 128 lb 8 oz (58.3 kg)   .Body mass index is 21.06 kg/m.  NAD GENERAL:alert, in no acute distress and comfortable SKIN: no acute rashes, no significant lesions EYES: conjunctiva are pink and non-injected, sclera anicteric OROPHARYNX: MMM, no exudates, no oropharyngeal erythema or ulceration NECK: supple, no JVD LYMPH:  no palpable lymphadenopathy in the cervical, axillary or inguinal regions LUNGS: clear to auscultation b/l with normal respiratory effort HEART: regular rate & rhythm ABDOMEN:  normoactive bowel sounds , non tender, not distended.  No palpable hepatosplenomegaly Extremity: no pedal edema PSYCH: alert & oriented x 3 with fluent speech NEURO: no focal motor/sensory deficits  LABORATORY DATA:  I have reviewed the data as listed  .    Latest Ref Rng & Units 05/16/2022    1:48 PM 05/03/2021    9:56 AM  CBC  WBC 4.0 - 10.5 K/uL 8.9  4.9   Hemoglobin 12.0 - 15.0 g/dL 13.6  13.0   Hematocrit 36.0 - 46.0 % 40.6  38.7   Platelets 150 - 400 K/uL 223  229     .    Latest Ref Rng & Units 05/16/2022    1:48 PM 05/03/2021    9:56 AM  CMP  Glucose 70 - 99 mg/dL 132  102   BUN 6 - 20 mg/dL 20  18   Creatinine 0.44 - 1.00 mg/dL 0.84  0.82   Sodium 135 - 145 mmol/L 137  139   Potassium 3.5 - 5.1 mmol/L 4.3  4.0   Chloride 98 - 111 mmol/L 101  105   CO2 22 - 32 mmol/L 31  30   Calcium 8.9 - 10.3 mg/dL 9.2  9.4   Total Protein 6.5 - 8.1 g/dL 6.9  6.6   Total Bilirubin 0.3 - 1.2 mg/dL 0.3  0.4   Alkaline Phos 38 - 126 U/L 44  57   AST 15 - 41 U/L 20  26   ALT 0 - 44 U/L 16  16      Lab Results  Component Value Date   LDH 169 05/16/2022    RADIOGRAPHIC STUDIES: I have personally reviewed the radiological images as listed and agreed with the findings in the report. No results found.  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  Likely related to her Sjogren's disease  PLAN: -Discussed lab results from today, 05/15/2022, with the patient. CBC is stable. CMP stable LDh WNL - no lab or clinical evidence of MZL progression at this time. -Recommended to continue to follow-up with Rheumatologist.   -Patient is currently on Rituxan to manage her Sjogren's syndrome which is likely also helping with her MZL.. -Patient will follow-up with Rheumatologist and will see [place] as needed.    FOLLOW-UP: RTC with Dr Irene Limbo as needed  The total time spent in the appointment was 21 minutes* .  All of the patient's questions were answered with apparent satisfaction. The patient knows to call the clinic with any problems, questions or concerns.   Sullivan Lone MD MS AAHIVMS [facility] Hematology/Oncology Physician [facility]  .*Total Encounter Time as defined by the Centers for Medicare and Medicaid Services includes, in addition to the face-to-face time of a patient visit (documented in the note above) non-face-to-face time: obtaining and reviewing outside history, ordering and reviewing medications, tests or procedures, care coordination (communications with other health care professionals or caregivers) and documentation in the medical record.   I, Cleda Mccreedy, am acting as a Education administrator for Sullivan Lone, MD. .I have reviewed the above documentation for accuracy and completeness, and I agree with the above. Brunetta Genera MD

## 2022-05-26 ENCOUNTER — Other Ambulatory Visit: Payer: Self-pay | Admitting: Internal Medicine

## 2022-05-26 DIAGNOSIS — M542 Cervicalgia: Secondary | ICD-10-CM

## 2022-05-26 DIAGNOSIS — R519 Headache, unspecified: Secondary | ICD-10-CM

## 2022-05-29 DIAGNOSIS — M25511 Pain in right shoulder: Secondary | ICD-10-CM | POA: Diagnosis not present

## 2022-06-01 DIAGNOSIS — E7800 Pure hypercholesterolemia, unspecified: Secondary | ICD-10-CM | POA: Diagnosis not present

## 2022-06-01 DIAGNOSIS — K219 Gastro-esophageal reflux disease without esophagitis: Secondary | ICD-10-CM | POA: Diagnosis not present

## 2022-06-01 DIAGNOSIS — E559 Vitamin D deficiency, unspecified: Secondary | ICD-10-CM | POA: Diagnosis not present

## 2022-06-02 DIAGNOSIS — S29011A Strain of muscle and tendon of front wall of thorax, initial encounter: Secondary | ICD-10-CM | POA: Diagnosis not present

## 2022-06-09 DIAGNOSIS — R82998 Other abnormal findings in urine: Secondary | ICD-10-CM | POA: Diagnosis not present

## 2022-06-09 DIAGNOSIS — L989 Disorder of the skin and subcutaneous tissue, unspecified: Secondary | ICD-10-CM | POA: Diagnosis not present

## 2022-06-09 DIAGNOSIS — Z0000 Encounter for general adult medical examination without abnormal findings: Secondary | ICD-10-CM | POA: Diagnosis not present

## 2022-06-09 DIAGNOSIS — E559 Vitamin D deficiency, unspecified: Secondary | ICD-10-CM | POA: Diagnosis not present

## 2022-06-09 DIAGNOSIS — F418 Other specified anxiety disorders: Secondary | ICD-10-CM | POA: Diagnosis not present

## 2022-06-09 DIAGNOSIS — M542 Cervicalgia: Secondary | ICD-10-CM | POA: Diagnosis not present

## 2022-06-09 DIAGNOSIS — Z1331 Encounter for screening for depression: Secondary | ICD-10-CM | POA: Diagnosis not present

## 2022-06-19 ENCOUNTER — Encounter: Payer: Self-pay | Admitting: Internal Medicine

## 2022-06-22 DIAGNOSIS — H209 Unspecified iridocyclitis: Secondary | ICD-10-CM | POA: Diagnosis not present

## 2022-06-22 DIAGNOSIS — R768 Other specified abnormal immunological findings in serum: Secondary | ICD-10-CM | POA: Diagnosis not present

## 2022-06-22 DIAGNOSIS — N059 Unspecified nephritic syndrome with unspecified morphologic changes: Secondary | ICD-10-CM | POA: Diagnosis not present

## 2022-06-22 DIAGNOSIS — M3501 Sicca syndrome with keratoconjunctivitis: Secondary | ICD-10-CM | POA: Diagnosis not present

## 2022-06-26 ENCOUNTER — Ambulatory Visit
Admission: RE | Admit: 2022-06-26 | Discharge: 2022-06-26 | Disposition: A | Discharge status: Home / Self Care | Payer: BC Managed Care – PPO | Source: Ambulatory Visit | Attending: Internal Medicine | Admitting: Internal Medicine

## 2022-06-26 DIAGNOSIS — M4802 Spinal stenosis, cervical region: Secondary | ICD-10-CM | POA: Diagnosis not present

## 2022-06-26 DIAGNOSIS — R519 Headache, unspecified: Secondary | ICD-10-CM

## 2022-06-26 DIAGNOSIS — M542 Cervicalgia: Secondary | ICD-10-CM

## 2022-07-03 ENCOUNTER — Ambulatory Visit: Payer: BC Managed Care – PPO | Admitting: Gastroenterology

## 2022-07-03 DIAGNOSIS — D472 Monoclonal gammopathy: Secondary | ICD-10-CM | POA: Diagnosis not present

## 2022-07-03 DIAGNOSIS — D84821 Immunodeficiency due to drugs: Secondary | ICD-10-CM | POA: Diagnosis not present

## 2022-07-03 DIAGNOSIS — Z79899 Other long term (current) drug therapy: Secondary | ICD-10-CM | POA: Diagnosis not present

## 2022-07-03 DIAGNOSIS — M3500 Sicca syndrome, unspecified: Secondary | ICD-10-CM | POA: Diagnosis not present

## 2022-07-03 DIAGNOSIS — N055 Unspecified nephritic syndrome with diffuse mesangiocapillary glomerulonephritis: Secondary | ICD-10-CM | POA: Diagnosis not present

## 2022-07-25 DIAGNOSIS — M5412 Radiculopathy, cervical region: Secondary | ICD-10-CM | POA: Diagnosis not present

## 2022-07-31 DIAGNOSIS — Z6821 Body mass index (BMI) 21.0-21.9, adult: Secondary | ICD-10-CM | POA: Diagnosis not present

## 2022-07-31 DIAGNOSIS — Z01419 Encounter for gynecological examination (general) (routine) without abnormal findings: Secondary | ICD-10-CM | POA: Diagnosis not present

## 2022-07-31 DIAGNOSIS — Z124 Encounter for screening for malignant neoplasm of cervix: Secondary | ICD-10-CM | POA: Diagnosis not present

## 2022-07-31 DIAGNOSIS — Z1151 Encounter for screening for human papillomavirus (HPV): Secondary | ICD-10-CM | POA: Diagnosis not present

## 2022-08-03 DIAGNOSIS — M5412 Radiculopathy, cervical region: Secondary | ICD-10-CM | POA: Diagnosis not present

## 2022-08-10 DIAGNOSIS — M5412 Radiculopathy, cervical region: Secondary | ICD-10-CM | POA: Diagnosis not present

## 2022-08-22 DIAGNOSIS — M5412 Radiculopathy, cervical region: Secondary | ICD-10-CM | POA: Diagnosis not present

## 2022-08-24 DIAGNOSIS — M5412 Radiculopathy, cervical region: Secondary | ICD-10-CM | POA: Diagnosis not present

## 2022-09-04 ENCOUNTER — Encounter: Payer: Self-pay | Admitting: Gastroenterology

## 2022-09-04 ENCOUNTER — Ambulatory Visit (INDEPENDENT_AMBULATORY_CARE_PROVIDER_SITE_OTHER): Payer: BC Managed Care – PPO | Admitting: Gastroenterology

## 2022-09-04 ENCOUNTER — Telehealth: Payer: Self-pay | Admitting: Gastroenterology

## 2022-09-04 VITALS — BP 102/60 | HR 78 | Ht 64.5 in | Wt 128.0 lb

## 2022-09-04 DIAGNOSIS — Z1211 Encounter for screening for malignant neoplasm of colon: Secondary | ICD-10-CM | POA: Diagnosis not present

## 2022-09-04 DIAGNOSIS — Z1212 Encounter for screening for malignant neoplasm of rectum: Secondary | ICD-10-CM | POA: Diagnosis not present

## 2022-09-04 DIAGNOSIS — K2100 Gastro-esophageal reflux disease with esophagitis, without bleeding: Secondary | ICD-10-CM

## 2022-09-04 NOTE — Telephone Encounter (Signed)
Called patient to advise. Will keep colonoscopy scheduled for 7/18.

## 2022-09-04 NOTE — Telephone Encounter (Signed)
[facility] schedule for Dr. Russella Dar does not show a opening for this Friday. Please offer another available date option if she wants to reschedule.

## 2022-09-04 NOTE — Progress Notes (Signed)
    Assessment     CRC screening, average risk  GERD with LA Grade A esophagitis, small hiatal hernia   Recommendations    Schedule colonoscopy. The risks (including bleeding, perforation, infection, missed lesions, medication reactions and possible hospitalization or surgery if complications occur), benefits, and alternatives to colonoscopy with possible biopsy and possible polypectomy were discussed with the patient and they consent to proceed.  She requests MiraLAX, Gatorade, Dulcolax prep. Nexium 20 mg OTC qd prn.  Follow antireflux measures. TUMS prn, Pepcid AC prn.   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  She is a former patient of Dr. Orvan Falconer.  She previously underwent colonoscopy in [place] over 10 years ago and she reports it was normal. She has been unable to obtain the procedure report. She treats her reflux symptoms with an OTC PPI on demand and then discontinues the PPI. She has not taken a PPI in over 1 year.  She would prefers not to take PPIs.  She has mild chronic constipation.  EGD below. Denies weight loss, abdominal pain, diarrhea, change in stool caliber, melena, hematochezia, nausea, vomiting, dysphagia, chest pain.  EGD Oct 2022 - LA Grade A reflux esophagitis with no bleeding. Biopsied.  - Small hiatal hernia.  - Erythematous mucosa in the gastric body and antrum. Biopsied.  - Normal examined duodenum.  - The examination was otherwise normal Path: reactive gastropathy, mild reflux changes   Labs / Imaging       Latest Ref Rng & Units 05/16/2022    1:48 PM 05/03/2021    9:56 AM  Hepatic Function  Total Protein 6.5 - 8.1 g/dL 6.9  6.6   Albumin 3.5 - 5.0 g/dL 4.5  4.2   AST 15 - 41 U/L 20  26   ALT 0 - 44 U/L 16  16   Alk Phosphatase 38 - 126 U/L 44  57   Total Bilirubin 0.3 - 1.2 mg/dL 0.3  0.4        Latest Ref Rng & Units 05/16/2022    1:48 PM 05/03/2021    9:56 AM  CBC   WBC 4.0 - 10.5 K/uL 8.9  4.9   Hemoglobin 12.0 - 15.0 g/dL 40.9  81.1   Hematocrit 36.0 - 46.0 % 40.6  38.7   Platelets 150 - 400 K/uL 223  229    Current Medications, Allergies, Past Medical History, Past Surgical History, Family History and Social History were reviewed in [employer] record.   Physical Exam: General: Well developed, well nourished, no acute distress Head: Normocephalic and atraumatic Eyes: Sclerae anicteric, EOMI Ears: Normal auditory acuity Mouth: No deformities or lesions noted Lungs: Clear throughout to auscultation Heart: Regular rate and rhythm; No murmurs, rubs or bruits Abdomen: Soft, non tender and non distended. No masses, hepatosplenomegaly or hernias noted. Normal Bowel sounds Rectal: Deferred to colonoscopy Musculoskeletal: Symmetrical with no gross deformities  Pulses:  Normal pulses noted Extremities: No edema or deformities noted Neurological: Alert oriented x 4, grossly nonfocal Psychological:  Alert and cooperative. Normal mood and affect   Desarea Ohagan T. Russella Dar, MD 09/04/2022, 8:32 AM

## 2022-09-04 NOTE — Patient Instructions (Signed)
You have been scheduled for a colonoscopy. Please follow written instructions given to you at your visit today.  Please pick up your prep supplies at the pharmacy within the next 1-3 days. If you use inhalers (even only as needed), please bring them with you on the day of your procedure.  _______________________________________________________  If your blood pressure at your visit was 140/90 or greater, please contact your primary care physician to follow up on this.  _______________________________________________________  If you are age 56 or older, your body mass index should be between 23-30. Your Body mass index is 21.63 kg/m. If this is out of the aforementioned range listed, please consider follow up with your Primary Care Provider.  If you are age 51 or younger, your body mass index should be between 19-25. Your Body mass index is 21.63 kg/m. If this is out of the aformentioned range listed, please consider follow up with your Primary Care Provider.   ________________________________________________________  The  [facility] providers would like to encourage you to use [facility] to communicate with providers for non-urgent requests or questions.  Due to long hold times on the telephone, sending your provider a message by [facility] may be a faster and more efficient way to get a response.  Please allow 48 business hours for a response.  Please remember that this is for non-urgent requests.  _______________________________________________________

## 2022-09-04 NOTE — Telephone Encounter (Signed)
Inbound call from patient requesting a call back to discuss if this Friday at 8:30 was still available to schedule her colonoscopy. Please advise, thank you.

## 2022-09-28 IMAGING — CT CT CARDIAC CORONARY ARTERY CALCIUM SCORE
3 series · 12 of 20 positions shown, 14 images · non-contrast
Comparison: None.

CLINICAL DATA: Elevated LDL. History of B-cell lymphoma. No
treatment.

EXAM:
CT CARDIAC CORONARY ARTERY CALCIUM SCORE
TECHNIQUE: Non-contrast imaging through the heart was performed using
prospective ECG gating. Image post processing was performed on an
independent workstation, allowing for quantitative analysis of the
heart and coronary arteries. Note that this exam targets the heart
and the chest was not imaged in its entirety.

[Series 2: calcium scoring 2.00 qr36 bestdiast 70% hrt calciu · axial · 0.37mm/px · z∈[+1614,+1642]mm · 2 of 70 slices shown]
[im 14/70  vessel]
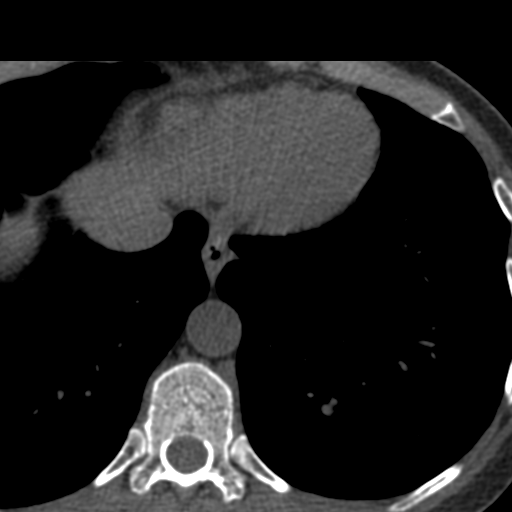
[im 28/70  vessel]
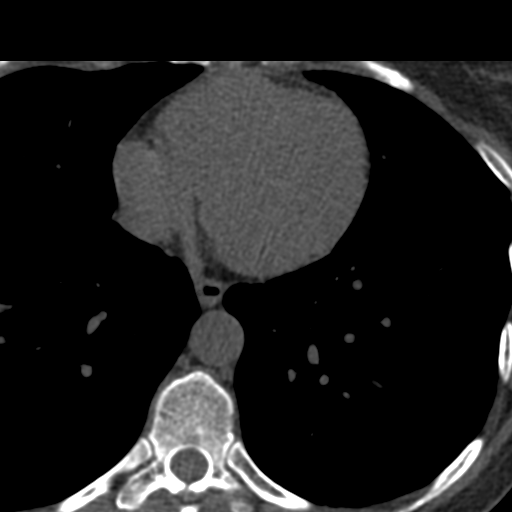

[Series 3: calcium scoring 2.00 br40 bestdiast 70% axial · axial · 0.46mm/px · z∈[+1610,+1702]mm · 5 of 70 slices shown, 7 images]
[im 12/70  vessel]
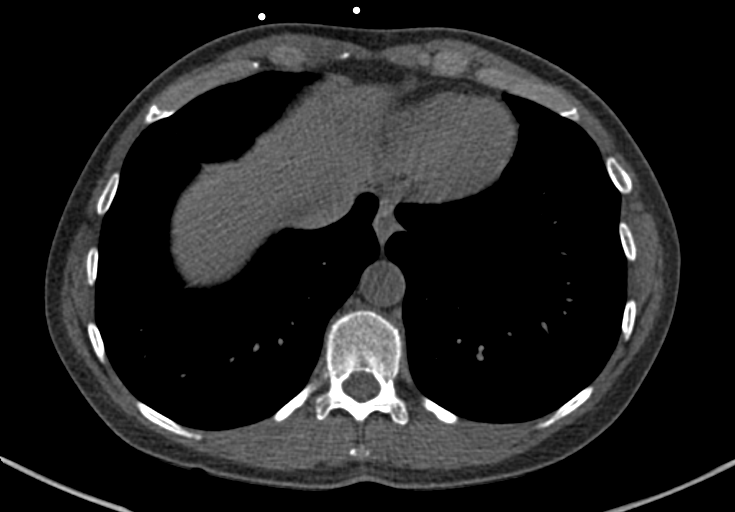
[im 12/70  lung]
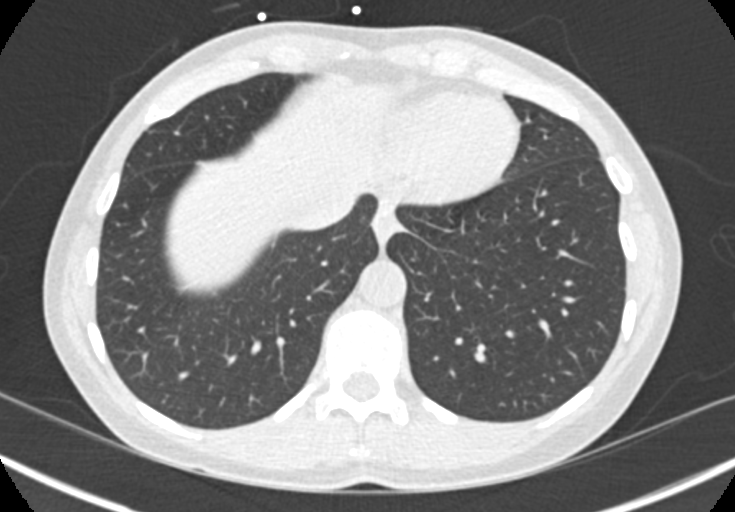
[im 24/70  vessel]
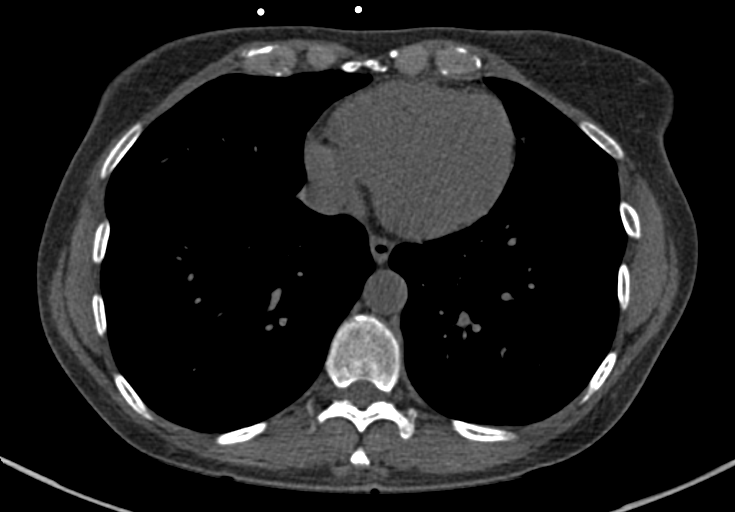
[im 35/70  vessel]
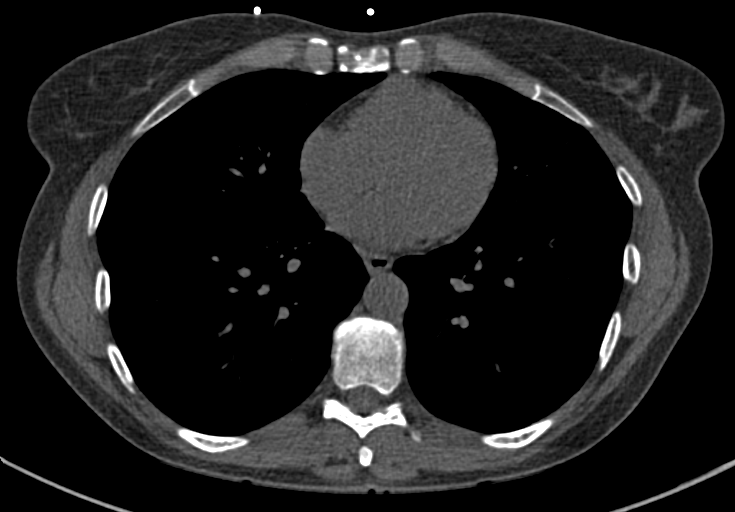
[im 47/70  vessel]
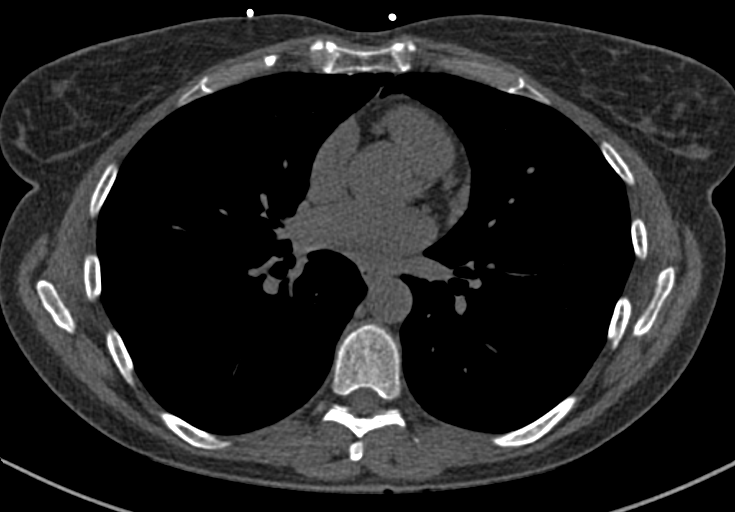
[im 58/70  vessel]
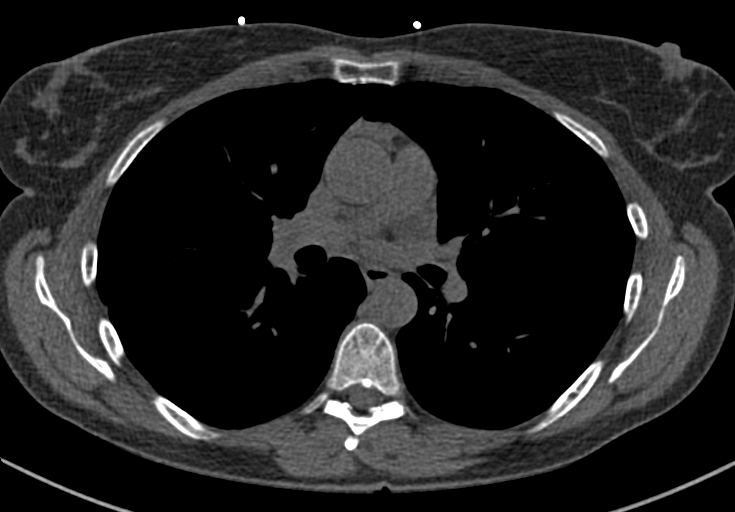
[im 58/70  lung]
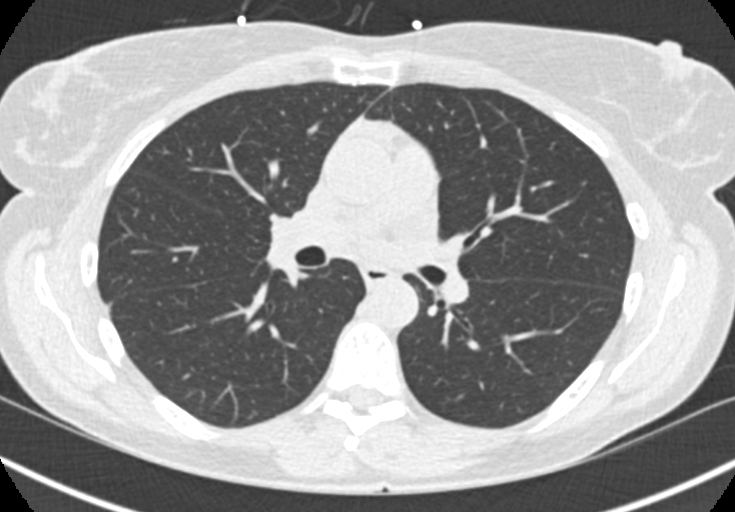

[Series 9: calcium scoring 2.00 br60 bestdiast 70% lungs · axial · 0.46mm/px · z∈[+1610,+1702]mm · 5 of 70 slices shown]
[im 12/70  vessel]
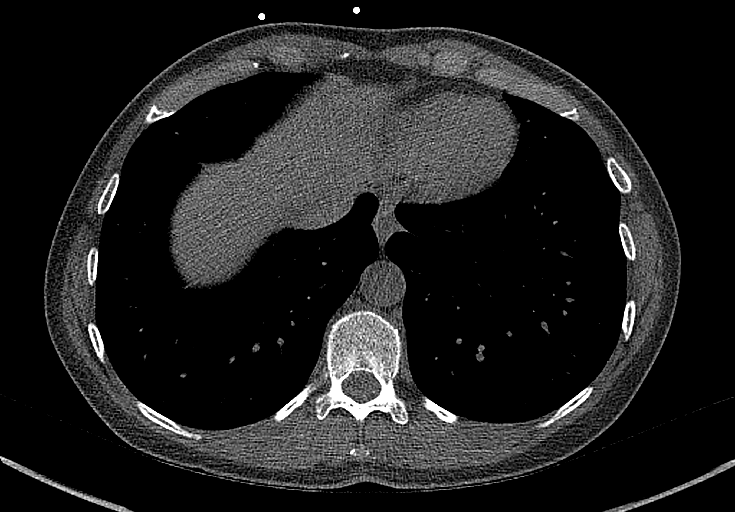
[im 24/70  vessel]
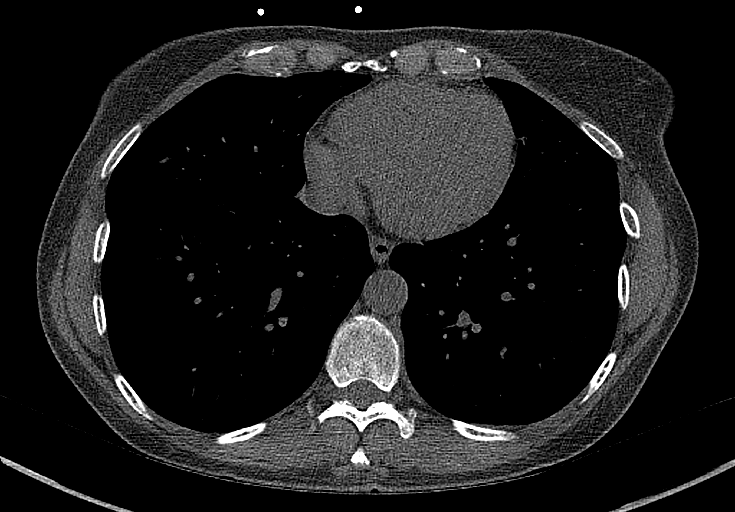
[im 35/70  vessel]
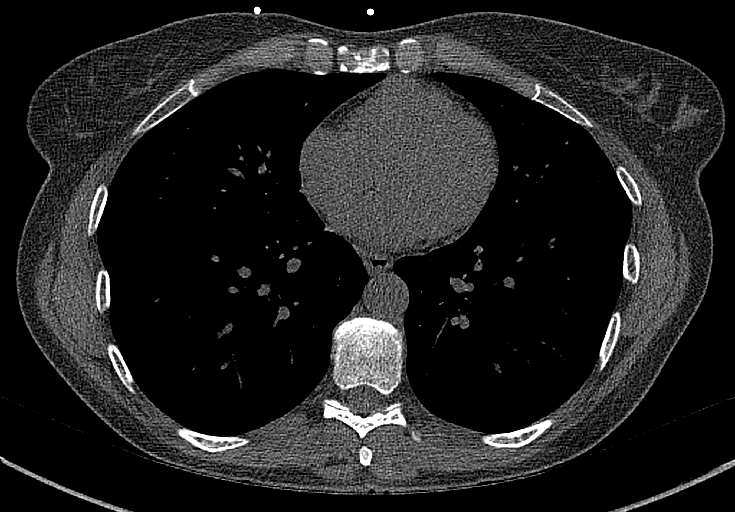
[im 47/70  vessel]
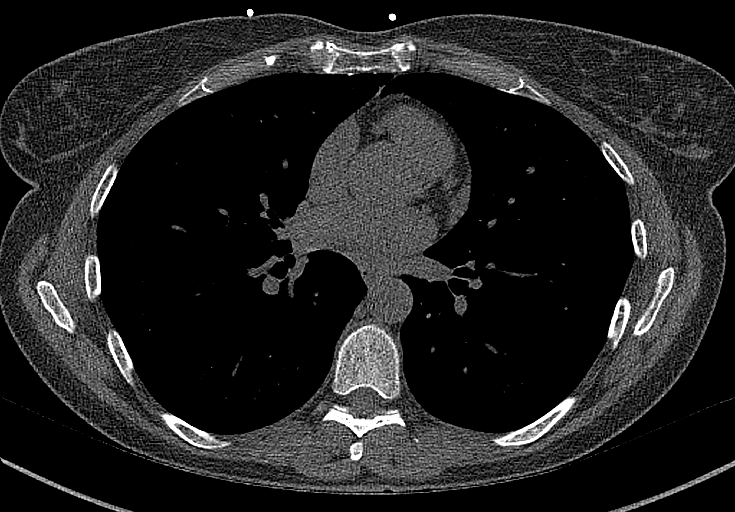
[im 58/70  vessel]
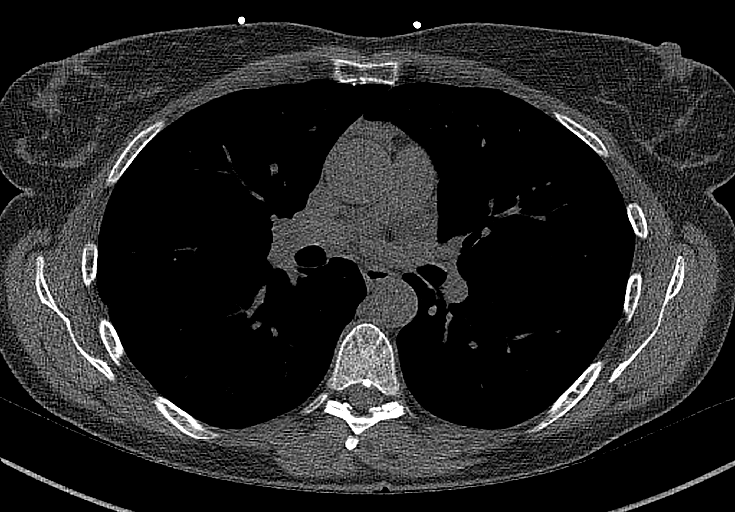

[12 of 20 positions shown; findings below may reference images not displayed]

FINDINGS: CORONARY CALCIUM SCORES:

Total Agatston Score: 0- No coronary calcium identified.

AORTA MEASUREMENTS:

Ascending Aorta: 30 mm

Descending Aorta: 20 mm

OTHER FINDINGS:

Cardiovascular: Normal aortic caliber. Normal heart size, without
pericardial effusion.

Mediastinum/Nodes: No imaged thoracic adenopathy.

Lungs/Pleura: No pleural fluid. Right minor fissure thickening
versus a subpleural lymph node on 32/9.

A left lower lobe pulmonary nodule measures 3 mm on [DATE].

Upper Abdomen: High right hepatic lobe 12 mm lesion is low-density
and likely a cyst. Normal imaged portions of the spleen, stomach.

Musculoskeletal: No acute osseous abnormality.
IMPRESSION: 1. No coronary calcium identified.
2. No acute process in the imaged chest.
3. 3 mm left lower lobe pulmonary nodule. No follow-up needed if
patient is low-risk. Non-contrast chest CT can be considered in 12
months if patient is high-risk. This recommendation follows the
consensus statement: Guidelines for Management of Incidental
Pulmonary Nodules Detected on CT Images: From the [HOSPITAL]

## 2022-10-03 DIAGNOSIS — M5412 Radiculopathy, cervical region: Secondary | ICD-10-CM | POA: Diagnosis not present

## 2022-10-04 DIAGNOSIS — M5412 Radiculopathy, cervical region: Secondary | ICD-10-CM | POA: Diagnosis not present

## 2022-10-05 ENCOUNTER — Encounter: Payer: Self-pay | Admitting: Gastroenterology

## 2022-10-05 ENCOUNTER — Ambulatory Visit (AMBULATORY_SURGERY_CENTER): Payer: BC Managed Care – PPO | Admitting: Gastroenterology

## 2022-10-05 VITALS — BP 118/68 | HR 68 | Temp 97.1°F | Resp 13 | Ht 64.0 in | Wt 128.0 lb

## 2022-10-05 DIAGNOSIS — Z1211 Encounter for screening for malignant neoplasm of colon: Secondary | ICD-10-CM

## 2022-10-05 MED ORDER — SODIUM CHLORIDE 0.9 % IV SOLN: 500.0000 mL | INTRAVENOUS | Status: DC

## 2022-10-05 NOTE — Progress Notes (Signed)
Sedate, gd SR, tolerated procedure well, VSS, report to RN 

## 2022-10-05 NOTE — Progress Notes (Signed)
History & Physical  Primary Care Physician:  Creola Corn, MD Primary Gastroenterologist: Claudette Head, MD  Impression / Plan:  Average risk CRC screening for colonoscopy.   CHIEF COMPLAINT:  CRC screening   HPI: Mindy Fuller is a 57 y.o. female average risk CRC screening for colonoscopy.    Past Medical History:  Diagnosis Date   Acute glomerulonephritis    s/p COVID 2019   Anxiety    Blood transfusion without reported diagnosis    Constipation    COVID 2019   Extranodal marginal zone B-cell lymphoma (HCC)    Gastritis    GERD (gastroesophageal reflux disease)    Low libido    Sjogren's syndrome (HCC)    Tension    Vitamin D deficiency    Vocal cord nodules     Past Surgical History:  Procedure Laterality Date   BREAST CYST ASPIRATION     LUMBAR FUSION     L5-S1   ORIF METATARSAL FRACTURE Left    PAROTIDECTOMY Right    RENAL BIOPSY     TONSILLECTOMY      Prior to Admission medications   Medication Sig Start Date End Date Taking? Authorizing Provider  acetaminophen (TYLENOL) 500 MG tablet Take 1 tablet by mouth as needed. 04/20/19  Yes [provider]  ALPRAZolam (XANAX) 0.25 MG tablet Take 0.25 mg by mouth as needed. 09/26/19  Yes [provider]  baclofen (LIORESAL) 10 MG tablet 10 mg. 02/01/22  Yes [provider]  traZODone (DESYREL) 100 MG tablet Take 1 tablet by mouth at bedtime.   Yes [provider]  tretinoin (RETIN-A) 0.1 % cream APPLY DIME SIZED AMOUNT TO ENTIRE FACE NIGHTLY AS TOLERATED   Yes [provider]  triamcinolone (NASACORT) 55 MCG/ACT AERO nasal inhaler Place 2 sprays into the nose daily.   Yes [provider]  COLLAGEN MATRIX, BOVINE, EX daily.    [provider]  riTUXimab (RITUXAN) 100 MG/10ML injection See admin instructions. Every 6 months    [provider]    Current Outpatient Medications  Medication Sig Dispense Refill   acetaminophen (TYLENOL) 500 MG  tablet Take 1 tablet by mouth as needed.     ALPRAZolam (XANAX) 0.25 MG tablet Take 0.25 mg by mouth as needed.     baclofen (LIORESAL) 10 MG tablet 10 mg.     traZODone (DESYREL) 100 MG tablet Take 1 tablet by mouth at bedtime.     tretinoin (RETIN-A) 0.1 % cream APPLY DIME SIZED AMOUNT TO ENTIRE FACE NIGHTLY AS TOLERATED     triamcinolone (NASACORT) 55 MCG/ACT AERO nasal inhaler Place 2 sprays into the nose daily.     COLLAGEN MATRIX, BOVINE, EX daily.     riTUXimab (RITUXAN) 100 MG/10ML injection See admin instructions. Every 6 months     Current Facility-Administered Medications  Medication Dose Route Frequency Provider Last Rate Last Admin   0.9 %  sodium chloride infusion  500 mL Intravenous Continuous Meryl Dare, MD       methylPREDNISolone sodium succinate (SOLU-MEDROL) 125 mg/2 mL injection 125 mg  125 mg Intramuscular Once PRN Loa Socks, NP        Allergies as of 10/05/2022 - Review Complete 10/05/2022  Allergen Reaction Noted   Clarithromycin Nausea And Vomiting and Nausea Only 01/24/2011   Amoxicillin Nausea Only 04/11/2019   Ciprofloxacin Nausea And Vomiting 10/19/2020   Hydrocodone-acetaminophen Other (See Comments) 09/28/2014    Family History  Problem Relation Age of Onset  Hypertension Mother    Other Mother        pre-diabetes   Allergic rhinitis Father    Coronary artery disease Father    Diabetes Mellitus I Father    Atrial fibrillation Father    Colon cancer Neg Hx    Esophageal cancer Neg Hx    Stomach cancer Neg Hx    Rectal cancer Neg Hx     Social History   Socioeconomic History   Marital status: Married    Spouse name: Not on file   Number of children: 2   Years of education: Not on file   Highest education level: Not on file  Occupational History   Occupation: Self Employed  Tobacco Use   Smoking status: Never    Passive exposure: Never   Smokeless tobacco: Never  Vaping Use   Vaping status: Never Used  Substance  and Sexual Activity   Alcohol use: Yes    Comment: once a month   Drug use: Never   Sexual activity: Not on file  Other Topics Concern   Not on file  Social History Narrative   Not on file   Social Determinants of Health   Financial Resource Strain: Low Risk  (04/06/2019)   Received from [facility], [facility]   Overall Financial Resource Strain (CARDIA)    Difficulty of Paying Living Expenses: Not hard at all  Food Insecurity: No Food Insecurity (04/06/2019)   Received from [facility], [facility]   Hunger Vital Sign    Worried About Running Out of Food in the Last Year: Never true    Ran Out of Food in the Last Year: Never true  Transportation Needs: No Transportation Needs (04/06/2019)   Received from [facility], [facility]   [employer] - Administrator, Civil Service (Medical): No    Lack of Transportation (Non-Medical): No  Physical Activity: Inactive (04/11/2019)   [facility] from [facility], [facility]   Exercise Vital Sign    Days of Exercise per Week: 0 days    Minutes of Exercise per Session: 0 min  Stress: Stress Concern Present (04/11/2019)   Received from [facility], [facility]   [employer] of Occupational Health - Occupational Stress Questionnaire    Feeling of Stress : Rather much  Social Connections: Unknown (08/02/2021)   Received from [facility]   Social Network    Social Network: Not on file  Intimate Partner Violence: Unknown (06/24/2021)   Received from [facility]   HITS    Physically Hurt: Not on file    Insult or Talk Down To: Not on file    Threaten Physical Harm: Not on file    Scream or Curse: Not on file    Review of Systems:  All systems reviewed were negative except where noted in HPI.   Physical Exam:  General:  Alert, well-developed, in NAD Head:  Normocephalic and atraumatic. Eyes:  Sclera clear, no icterus.   Conjunctiva  pink. Ears:  Normal auditory acuity. Mouth:  No deformity or lesions.  Neck:  Supple; no masses. Lungs:  Clear throughout to auscultation.   No wheezes, crackles, or rhonchi.  Heart:  Regular rate and rhythm; no murmurs. Abdomen:  Soft, nondistended, nontender. No masses, hepatomegaly. No palpable masses.  Normal bowel sounds.    Rectal:  Deferred   Msk:  Symmetrical without gross deformities. Extremities:  Without edema. Neurologic:  Alert and  oriented x 4; grossly normal neurologically. Skin:  Intact  without significant lesions or rashes. Psych:  Alert and cooperative. Normal mood and affect.   Venita Lick. Russella Dar  10/05/2022, 11:09 AM See Loretha Stapler, [facility], to contact our on call provider

## 2022-10-05 NOTE — Op Note (Signed)
[facility] Patient Name: Mindy Fuller Procedure Date: 10/05/2022 11:10 AM MRN: [id number] Endoscopist: Meryl Dare , MD, [phone] Age: 57 Referring MD:  Date of Birth: April 16, 1965 Gender: Female Account #: [id number] Procedure:                Colonoscopy Indications:              Screening for colorectal malignant neoplasm Medicines:                Monitored Anesthesia Care Procedure:                Pre-Anesthesia Assessment:                           - Prior to the procedure, a History and Physical                            was performed, and patient medications and                            allergies were reviewed. The patient's tolerance of                            previous anesthesia was also reviewed. The risks                            and benefits of the procedure and the sedation                            options and risks were discussed with the patient.                            All questions were answered, and informed consent                            was obtained. Prior Anticoagulants: The patient has                            taken no anticoagulant or antiplatelet agents. ASA                            Grade Assessment: I - A normal, healthy patient.                            After reviewing the risks and benefits, the patient                            was deemed in satisfactory condition to undergo the                            procedure.                           After obtaining informed consent, the colonoscope  was passed under direct vision. Throughout the                            procedure, the patient's blood pressure, pulse, and                            oxygen saturations were monitored continuously. The                            Olympus PCF-H190DL (#[id number]) Colonoscope was                            introduced through the anus and advanced to the the                            cecum, identified by  appendiceal orifice and                            ileocecal valve. The ileocecal valve, appendiceal                            orifice, and rectum were photographed. The quality                            of the bowel preparation was excellent. The                            colonoscopy was performed without difficulty. The                            patient tolerated the procedure well. Scope In: 11:20:54 AM Scope Out: 11:38:28 AM Scope Withdrawal Time: 0 hours 12 minutes 18 seconds  Total Procedure Duration: 0 hours 17 minutes 34 seconds  Findings:                 The perianal and digital rectal examinations were                            normal.                           External hemorrhoids were found during                            retroflexion. The hemorrhoids were small.                           The exam was otherwise without abnormality on                            direct and retroflexion views. Complications:            No immediate complications. Estimated blood loss:                            None. Estimated Blood Loss:  Estimated blood loss: none. Impression:               - External hemorrhoids.                           - The examination was otherwise normal on direct                            and retroflexion views.                           - No specimens collected. Recommendation:           - Repeat colonoscopy in 10 years for screening                            purposes.                           - Patient has a contact number available for                            emergencies. The signs and symptoms of potential                            delayed complications were discussed with the                            patient. Return to normal activities tomorrow.                            Written discharge instructions were provided to the                            patient.                           - Resume previous diet.                           -  Continue present medications. Meryl Dare, MD 10/05/2022 11:41:13 AM This report has been signed electronically.

## 2022-10-05 NOTE — Progress Notes (Signed)
Pt's states no medical or surgical changes since previsit or office visit. 

## 2022-10-05 NOTE — Patient Instructions (Signed)
REPEAT Colonoscopy in 10 years for screening purposes.  YOU HAD AN ENDOSCOPIC PROCEDURE TODAY AT THE [facility]:   Refer to the procedure report that was given to you for any specific questions about what was found during the examination.  If the procedure report does not answer your questions, please call your gastroenterologist to clarify.  If you requested that your care partner not be given the details of your procedure findings, then the procedure report has been included in a sealed envelope for you to review at your convenience later.  YOU SHOULD EXPECT: Some feelings of bloating in the abdomen. Passage of more gas than usual.  Walking can help get rid of the air that was put into your GI tract during the procedure and reduce the bloating. If you had a lower endoscopy (such as a colonoscopy or flexible sigmoidoscopy) you may notice spotting of blood in your stool or on the toilet paper. If you underwent a bowel prep for your procedure, you may not have a normal bowel movement for a few days.  Please Note:  You might notice some irritation and congestion in your nose or some drainage.  This is from the oxygen used during your procedure.  There is no need for concern and it should clear up in a day or so.  SYMPTOMS TO REPORT IMMEDIATELY:  Following lower endoscopy (colonoscopy or flexible sigmoidoscopy):  Excessive amounts of blood in the stool  Significant tenderness or worsening of abdominal pains  Swelling of the abdomen that is new, acute  Fever of 100F or higher  For urgent or emergent issues, a gastroenterologist can be reached at any hour by calling [phone]. Do not use MyChart messaging for urgent concerns.    DIET:  We do recommend a small meal at first, but then you may proceed to your regular diet.  Drink plenty of fluids but you should avoid alcoholic beverages for 24 hours.  ACTIVITY:  You should plan to take it easy for the rest of today and you should  NOT DRIVE or use heavy machinery until tomorrow (because of the sedation medicines used during the test).    FOLLOW UP: Our staff will call the number listed on your records the next business day following your procedure.  We will call around 7:15- 8:00 am to check on you and address any questions or concerns that you may have regarding the information given to you following your procedure. If we do not reach you, we will leave a message.     If any biopsies were taken you will be contacted by phone or by letter within the next 1-3 weeks.  Please call us at [phone] if you have not heard about the biopsies in 3 weeks.    SIGNATURES/CONFIDENTIALITY: You and/or your care partner have signed paperwork which will be entered into your electronic medical record.  These signatures attest to the fact that that the information above on your After Visit Summary has been reviewed and is understood.  Full responsibility of the confidentiality of this discharge information lies with you and/or your care-partner.

## 2022-10-06 DIAGNOSIS — M5412 Radiculopathy, cervical region: Secondary | ICD-10-CM | POA: Diagnosis not present

## 2022-10-06 DIAGNOSIS — M65341 Trigger finger, right ring finger: Secondary | ICD-10-CM | POA: Diagnosis not present

## 2022-10-10 DIAGNOSIS — M5412 Radiculopathy, cervical region: Secondary | ICD-10-CM | POA: Diagnosis not present

## 2022-10-12 DIAGNOSIS — M5412 Radiculopathy, cervical region: Secondary | ICD-10-CM | POA: Diagnosis not present

## 2022-10-13 DIAGNOSIS — M3500 Sicca syndrome, unspecified: Secondary | ICD-10-CM | POA: Diagnosis not present

## 2022-10-13 DIAGNOSIS — H209 Unspecified iridocyclitis: Secondary | ICD-10-CM | POA: Diagnosis not present

## 2022-10-16 DIAGNOSIS — Z79899 Other long term (current) drug therapy: Secondary | ICD-10-CM | POA: Diagnosis not present

## 2022-10-16 DIAGNOSIS — N055 Unspecified nephritic syndrome with diffuse mesangiocapillary glomerulonephritis: Secondary | ICD-10-CM | POA: Diagnosis not present

## 2022-10-16 DIAGNOSIS — K219 Gastro-esophageal reflux disease without esophagitis: Secondary | ICD-10-CM | POA: Diagnosis not present

## 2022-10-16 DIAGNOSIS — D84821 Immunodeficiency due to drugs: Secondary | ICD-10-CM | POA: Diagnosis not present

## 2022-10-24 DIAGNOSIS — R490 Dysphonia: Secondary | ICD-10-CM | POA: Diagnosis not present

## 2022-10-24 DIAGNOSIS — M5412 Radiculopathy, cervical region: Secondary | ICD-10-CM | POA: Diagnosis not present

## 2022-10-24 DIAGNOSIS — M3501 Sicca syndrome with keratoconjunctivitis: Secondary | ICD-10-CM | POA: Diagnosis not present

## 2022-10-24 DIAGNOSIS — J3489 Other specified disorders of nose and nasal sinuses: Secondary | ICD-10-CM | POA: Diagnosis not present

## 2022-10-26 DIAGNOSIS — M5412 Radiculopathy, cervical region: Secondary | ICD-10-CM | POA: Diagnosis not present

## 2022-10-31 DIAGNOSIS — M5412 Radiculopathy, cervical region: Secondary | ICD-10-CM | POA: Diagnosis not present

## 2022-11-09 DIAGNOSIS — M5412 Radiculopathy, cervical region: Secondary | ICD-10-CM | POA: Diagnosis not present

## 2022-11-14 DIAGNOSIS — M5412 Radiculopathy, cervical region: Secondary | ICD-10-CM | POA: Diagnosis not present

## 2022-11-16 DIAGNOSIS — M5412 Radiculopathy, cervical region: Secondary | ICD-10-CM | POA: Diagnosis not present

## 2023-01-15 DIAGNOSIS — L738 Other specified follicular disorders: Secondary | ICD-10-CM | POA: Diagnosis not present

## 2023-01-15 DIAGNOSIS — L820 Inflamed seborrheic keratosis: Secondary | ICD-10-CM | POA: Diagnosis not present

## 2023-01-15 DIAGNOSIS — L570 Actinic keratosis: Secondary | ICD-10-CM | POA: Diagnosis not present

## 2023-01-15 DIAGNOSIS — I788 Other diseases of capillaries: Secondary | ICD-10-CM | POA: Diagnosis not present

## 2023-01-15 DIAGNOSIS — D2262 Melanocytic nevi of left upper limb, including shoulder: Secondary | ICD-10-CM | POA: Diagnosis not present

## 2023-01-15 DIAGNOSIS — L819 Disorder of pigmentation, unspecified: Secondary | ICD-10-CM | POA: Diagnosis not present

## 2023-02-01 DIAGNOSIS — H209 Unspecified iridocyclitis: Secondary | ICD-10-CM | POA: Diagnosis not present

## 2023-02-01 DIAGNOSIS — M3501 Sicca syndrome with keratoconjunctivitis: Secondary | ICD-10-CM | POA: Diagnosis not present

## 2023-02-01 DIAGNOSIS — N059 Unspecified nephritic syndrome with unspecified morphologic changes: Secondary | ICD-10-CM | POA: Diagnosis not present

## 2023-02-01 DIAGNOSIS — R768 Other specified abnormal immunological findings in serum: Secondary | ICD-10-CM | POA: Diagnosis not present

## 2023-02-05 DIAGNOSIS — R87611 Atypical squamous cells cannot exclude high grade squamous intraepithelial lesion on cytologic smear of cervix (ASC-H): Secondary | ICD-10-CM | POA: Diagnosis not present

## 2023-02-05 DIAGNOSIS — R87619 Unspecified abnormal cytological findings in specimens from cervix uteri: Secondary | ICD-10-CM | POA: Diagnosis not present

## 2023-02-06 DIAGNOSIS — M6289 Other specified disorders of muscle: Secondary | ICD-10-CM | POA: Diagnosis not present

## 2023-02-06 DIAGNOSIS — R809 Proteinuria, unspecified: Secondary | ICD-10-CM | POA: Diagnosis not present

## 2023-02-06 DIAGNOSIS — M3500 Sicca syndrome, unspecified: Secondary | ICD-10-CM | POA: Diagnosis not present

## 2023-02-08 DIAGNOSIS — N055 Unspecified nephritic syndrome with diffuse mesangiocapillary glomerulonephritis: Secondary | ICD-10-CM | POA: Diagnosis not present

## 2023-02-08 DIAGNOSIS — C8510 Unspecified B-cell lymphoma, unspecified site: Secondary | ICD-10-CM | POA: Diagnosis not present

## 2023-02-08 DIAGNOSIS — M3500 Sicca syndrome, unspecified: Secondary | ICD-10-CM | POA: Diagnosis not present

## 2023-03-05 DIAGNOSIS — E559 Vitamin D deficiency, unspecified: Secondary | ICD-10-CM | POA: Diagnosis not present

## 2023-03-05 DIAGNOSIS — Z131 Encounter for screening for diabetes mellitus: Secondary | ICD-10-CM | POA: Diagnosis not present

## 2023-03-05 DIAGNOSIS — G4700 Insomnia, unspecified: Secondary | ICD-10-CM | POA: Diagnosis not present

## 2023-03-05 DIAGNOSIS — Z1329 Encounter for screening for other suspected endocrine disorder: Secondary | ICD-10-CM | POA: Diagnosis not present

## 2023-03-05 DIAGNOSIS — E039 Hypothyroidism, unspecified: Secondary | ICD-10-CM | POA: Diagnosis not present

## 2023-03-05 DIAGNOSIS — D519 Vitamin B12 deficiency anemia, unspecified: Secondary | ICD-10-CM | POA: Diagnosis not present

## 2023-03-05 DIAGNOSIS — N951 Menopausal and female climacteric states: Secondary | ICD-10-CM | POA: Diagnosis not present

## 2023-03-05 DIAGNOSIS — E611 Iron deficiency: Secondary | ICD-10-CM | POA: Diagnosis not present

## 2023-03-05 DIAGNOSIS — M2550 Pain in unspecified joint: Secondary | ICD-10-CM | POA: Diagnosis not present

## 2023-03-05 DIAGNOSIS — R519 Headache, unspecified: Secondary | ICD-10-CM | POA: Diagnosis not present

## 2023-03-05 DIAGNOSIS — E538 Deficiency of other specified B group vitamins: Secondary | ICD-10-CM | POA: Diagnosis not present

## 2023-03-19 DIAGNOSIS — M79641 Pain in right hand: Secondary | ICD-10-CM | POA: Diagnosis not present

## 2023-03-23 DIAGNOSIS — M65341 Trigger finger, right ring finger: Secondary | ICD-10-CM | POA: Diagnosis not present

## 2023-03-23 DIAGNOSIS — M65311 Trigger thumb, right thumb: Secondary | ICD-10-CM | POA: Diagnosis not present

## 2023-04-10 DIAGNOSIS — G4700 Insomnia, unspecified: Secondary | ICD-10-CM | POA: Diagnosis not present

## 2023-04-10 DIAGNOSIS — E039 Hypothyroidism, unspecified: Secondary | ICD-10-CM | POA: Diagnosis not present

## 2023-04-10 DIAGNOSIS — R519 Headache, unspecified: Secondary | ICD-10-CM | POA: Diagnosis not present

## 2023-04-10 DIAGNOSIS — M2550 Pain in unspecified joint: Secondary | ICD-10-CM | POA: Diagnosis not present

## 2023-04-12 DIAGNOSIS — M5412 Radiculopathy, cervical region: Secondary | ICD-10-CM | POA: Diagnosis not present

## 2023-04-16 DIAGNOSIS — R590 Localized enlarged lymph nodes: Secondary | ICD-10-CM | POA: Diagnosis not present

## 2023-04-17 ENCOUNTER — Encounter: Payer: Self-pay | Admitting: Adult Health

## 2023-04-17 ENCOUNTER — Other Ambulatory Visit: Payer: Self-pay | Admitting: Adult Health

## 2023-04-17 DIAGNOSIS — Z8572 Personal history of non-Hodgkin lymphomas: Secondary | ICD-10-CM

## 2023-04-17 DIAGNOSIS — M542 Cervicalgia: Secondary | ICD-10-CM

## 2023-04-17 DIAGNOSIS — R590 Localized enlarged lymph nodes: Secondary | ICD-10-CM

## 2023-04-18 ENCOUNTER — Ambulatory Visit
Admission: RE | Admit: 2023-04-18 | Discharge: 2023-04-18 | Discharge status: Home / Self Care | Payer: BC Managed Care – PPO | Source: Ambulatory Visit | Attending: Adult Health

## 2023-04-18 DIAGNOSIS — R221 Localized swelling, mass and lump, neck: Secondary | ICD-10-CM | POA: Diagnosis not present

## 2023-04-18 DIAGNOSIS — Z8572 Personal history of non-Hodgkin lymphomas: Secondary | ICD-10-CM

## 2023-04-18 DIAGNOSIS — R590 Localized enlarged lymph nodes: Secondary | ICD-10-CM

## 2023-04-18 DIAGNOSIS — M542 Cervicalgia: Secondary | ICD-10-CM

## 2023-04-18 MED ORDER — IOPAMIDOL (ISOVUE-300) INJECTION 61%
75.0000 mL | Freq: Once | INTRAVENOUS | Status: AC | PRN
  Administered 2023-04-18: 75 mL via INTRAVENOUS

## 2023-04-20 DIAGNOSIS — N059 Unspecified nephritic syndrome with unspecified morphologic changes: Secondary | ICD-10-CM | POA: Diagnosis not present

## 2023-04-20 DIAGNOSIS — H209 Unspecified iridocyclitis: Secondary | ICD-10-CM | POA: Diagnosis not present

## 2023-04-20 DIAGNOSIS — M3501 Sicca syndrome with keratoconjunctivitis: Secondary | ICD-10-CM | POA: Diagnosis not present

## 2023-04-20 DIAGNOSIS — Z1231 Encounter for screening mammogram for malignant neoplasm of breast: Secondary | ICD-10-CM | POA: Diagnosis not present

## 2023-04-20 DIAGNOSIS — R768 Other specified abnormal immunological findings in serum: Secondary | ICD-10-CM | POA: Diagnosis not present

## 2023-04-24 DIAGNOSIS — M3501 Sicca syndrome with keratoconjunctivitis: Secondary | ICD-10-CM | POA: Diagnosis not present

## 2023-04-24 DIAGNOSIS — Z9049 Acquired absence of other specified parts of digestive tract: Secondary | ICD-10-CM | POA: Diagnosis not present

## 2023-05-08 ENCOUNTER — Encounter: Payer: Self-pay | Admitting: Gastroenterology

## 2023-05-08 ENCOUNTER — Ambulatory Visit (INDEPENDENT_AMBULATORY_CARE_PROVIDER_SITE_OTHER): Payer: BC Managed Care – PPO | Admitting: Gastroenterology

## 2023-05-08 VITALS — BP 98/72 | HR 89 | Ht 64.0 in | Wt 127.0 lb

## 2023-05-08 DIAGNOSIS — K2100 Gastro-esophageal reflux disease with esophagitis, without bleeding: Secondary | ICD-10-CM | POA: Diagnosis not present

## 2023-05-08 DIAGNOSIS — Z8719 Personal history of other diseases of the digestive system: Secondary | ICD-10-CM | POA: Diagnosis not present

## 2023-05-08 DIAGNOSIS — R1084 Generalized abdominal pain: Secondary | ICD-10-CM

## 2023-05-08 DIAGNOSIS — R109 Unspecified abdominal pain: Secondary | ICD-10-CM

## 2023-05-08 NOTE — Patient Instructions (Addendum)
 Please purchase the following medications over the counter and take as directed: Nexium- daily  Gaviscon-every night    We have given you samples of the following medication to take: IBGARD- as needed    _______________________________________________________  If your blood pressure at your visit was 140/90 or greater, please contact your primary care physician to follow up on this.  _______________________________________________________  If you are age 74 or older, your body mass index should be between 23-30. Your Body mass index is 21.8 kg/m. If this is out of the aforementioned range listed, please consider follow up with your Primary Care Provider.  If you are age 68 or younger, your body mass index should be between 19-25. Your Body mass index is 21.8 kg/m. If this is out of the aformentioned range listed, please consider follow up with your Primary Care Provider.   ________________________________________________________  The [facility] providers would like to encourage you to use [facility] to communicate with providers for non-urgent requests or questions.  Due to long hold times on the telephone, sending your provider a message by [facility] may be a faster and more efficient way to get a response.  Please allow 48 business hours for a response.  Please remember that this is for non-urgent requests.  _______________________________________________________ It was a pleasure to see you today!  Thank you for trusting me with your gastrointestinal care!

## 2023-05-08 NOTE — Progress Notes (Signed)
 HPI : Mindy Fuller is a 58 y.o. 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  She was initially evaluated by Dr. Orvan Falconer back in 2022 and underwent an upper endoscopy which showed LA grade a esophagitis and a small hiatal hernia.  She was seen by Dr. Russella Dar last year and underwent a screening colonoscopy which was unremarkable.  Today, the patient reports that her GERD symptoms have increased recently.  She admits that she has been under a lot of stress the past several months, related to the recent passing of her mother, as well as her husband who has metastatic renal cancer.  She prefers to manage her medical conditions without medications is much as possible.  To manage her GERD, she follows a very healthy diet natural foods 95% of the time.  She does not smoke and rarely drinks alcohol.  She typically eats dinner by 6:00 every night. More recently, she cut out all processed sugar. Despite these interventions, she has been having daily persistent GERD symptoms.  Her symptoms consist of a fullness in the chest, prompting her to frequently swallow, which does help.  She has been having frequent nausea, dyspnea onset hoarseness, which is new for her.   Her nausea and hoarseness tend to be worse at night and upon awakening in the morning. No dysphagia.  Her weight has been stable.  She has been taking Tums multiple times a day, which provides transient relief. She denies any lower GI symptoms such as constipation, diarrhea or blood in the stool. She has been having recurrent episodes of crampy abdominal pain, which do sometimes awaken her from sleep.  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    Identifies first GI symptoms in 1998.  She has a history of GERD, reflux, constipation biliary colic with history of possible SOD (declined evaluation at [facility] at the time) with persistent symptoms despite cholecystectomy.  Colonoscopy October 05, 2022 Dr. Russella Dar Indication: Screening External hemorrhoids, otherwise normal colonoscopy Recommend repeat 10 years   EGD Oct 2022 Dr. Orvan Falconer - LA Grade A reflux esophagitis with no bleeding. Biopsied.  - Small hiatal hernia.  - Erythematous mucosa in the gastric body and antrum. Biopsied.  - Normal examined duodenum.  - The examination was otherwise normal Path: reactive gastropathy, mild reflux changes  EGD 02/05/19 for reflux and epigastric abdominal pain: chronic inactive gastritis, LA Class A reflux esophagitis, small hiatal hernia, normal duodenal biopsie   Past Medical History:  Diagnosis Date   Acute glomerulonephritis    s/p COVID 2019   Anxiety    Blood transfusion without reported diagnosis    Constipation    COVID 2019   Extranodal marginal zone B-cell lymphoma (HCC)    Gastritis    GERD (gastroesophageal reflux disease)    Low libido    Sjogren's syndrome (HCC)    Tension    Vitamin D deficiency    Vocal cord nodules      Past Surgical History:  Procedure Laterality Date   BREAST CYST ASPIRATION     LUMBAR FUSION     L5-S1   ORIF METATARSAL FRACTURE Left    PAROTIDECTOMY Right  RENAL BIOPSY     TONSILLECTOMY     Family History  Problem Relation Age of Onset   Hypertension Mother    Other Mother        pre-diabetes   Allergic rhinitis Father    Coronary artery disease Father    Diabetes Mellitus I Father    Atrial fibrillation Father    Colon cancer Neg Hx    Esophageal cancer Neg Hx    Stomach cancer Neg Hx    Rectal cancer Neg Hx    Social History   Tobacco Use   Smoking status: Never    Passive exposure:  Never   Smokeless tobacco: Never  Vaping Use   Vaping status: Never Used  Substance Use Topics   Alcohol use: Yes    Comment: once a month   Drug use: Never   Current Outpatient Medications  Medication Sig Dispense Refill   acetaminophen (TYLENOL) 500 MG tablet Take 1 tablet by mouth as needed.     ALPRAZolam (XANAX) 0.25 MG tablet Take 0.25 mg by mouth as needed.     baclofen (LIORESAL) 10 MG tablet 10 mg.     COLLAGEN MATRIX, BOVINE, EX daily.     riTUXimab (RITUXAN) 100 MG/10ML injection See admin instructions. Every 6 months     traZODone (DESYREL) 100 MG tablet Take 1 tablet by mouth at bedtime.     tretinoin (RETIN-A) 0.1 % cream APPLY DIME SIZED AMOUNT TO ENTIRE FACE NIGHTLY AS TOLERATED     triamcinolone (NASACORT) 55 MCG/ACT AERO nasal inhaler Place 2 sprays into the nose daily.     Current Facility-Administered Medications  Medication Dose Route Frequency Provider Last Rate Last Admin   methylPREDNISolone sodium succinate (SOLU-MEDROL) 125 mg/2 mL injection 125 mg  125 mg Intramuscular Once PRN Causey, Larna Daughters, NP       Allergies  Allergen Reactions   Clarithromycin Nausea And Vomiting and Nausea Only    Other reaction(s): Other (See Comments) Anxiety Anxiety    Amoxicillin Nausea Only   Ciprofloxacin Nausea And Vomiting   Hydrocodone-Acetaminophen Other (See Comments)    Biliary Colic, can take medication if taking a PPI with it      Review of Systems: All systems reviewed and negative except where noted in HPI.    CT SOFT TISSUE NECK W CONTRAST Result Date: 04/24/2023 CLINICAL DATA:  History of right parotidectomy, small lump in the soft tissue the neck below jaw line, which the patient cannot feel at the moment; history of marginal zone lymphoma EXAM: CT NECK WITH CONTRAST TECHNIQUE: Multidetector CT imaging of the neck was performed using the standard protocol following the bolus administration of intravenous contrast. RADIATION DOSE REDUCTION: This  exam was performed according to the departmental dose-optimization program which includes automated exposure control, adjustment of the mA and/or kV according to patient size and/or use of iterative reconstruction technique. CONTRAST:  75mL ISOVUE-300 IOPAMIDOL (ISOVUE-300) INJECTION 61% COMPARISON:  05/09/2021 FINDINGS: Pharynx and larynx: Normal. No mass or swelling. Salivary glands: Atrophy of the parotid and submandibular glands, with additional postsurgical changes in the right parotid region. No definite masslike soft tissue. Thyroid: Normal. Lymph nodes: None enlarged or abnormal density. Vascular: Patent major arteries and veins of the neck. Limited intracranial: No acute finding. Visualized orbits: Negative. Mastoids and visualized paranasal sinuses: Clear. Skeleton: No acute osseous abnormality. Upper chest: No focal pulmonary opacity or pleural effusion. Other: No finding is seen in the superficial tissues of right submandibular region. IMPRESSION: 1. No acute  process in the neck. No finding is seen in the superficial tissues of right submandibular region. 2. Postsurgical changes in the right parotid region, without mass or lymphadenopathy. Electronically Signed   By: Wiliam Ke M.D.   On: 04/24/2023 13:56    Physical Exam: BP 98/72   Pulse 89   Ht 5\' 4"  (1.626 m)   Wt 127 lb (57.6 kg)   BMI 21.80 kg/m  Constitutional: Pleasant,well-developed, Caucasian female in no acute distress. HEENT: Normocephalic and atraumatic. Conjunctivae are normal. No scleral icterus. Neck supple.  Cardiovascular: Normal rate, regular rhythm.  Pulmonary/chest: Effort normal and breath sounds normal. No wheezing, rales or rhonchi. Abdominal: Soft, nondistended, nontender. Bowel sounds active throughout. There are no masses palpable. No hepatomegaly. Extremities: no edema Neurological: Alert and oriented to person place and time. Skin: Skin is warm and dry. No rashes noted. Psychiatric: Normal mood and  affect. Behavior is normal.  CBC    Component Value Date/Time   WBC 8.9 05/16/2022 1348   RBC 4.43 05/16/2022 1348   HGB 13.6 05/16/2022 1348   HCT 40.6 05/16/2022 1348   PLT 223 05/16/2022 1348   MCV 91.6 05/16/2022 1348   MCH 30.7 05/16/2022 1348   MCHC 33.5 05/16/2022 1348   RDW 12.1 05/16/2022 1348   LYMPHSABS 0.9 05/16/2022 1348   MONOABS 0.2 05/16/2022 1348   EOSABS 0.0 05/16/2022 1348   BASOSABS 0.0 05/16/2022 1348    CMP     Component Value Date/Time   NA 137 05/16/2022 1348   K 4.3 05/16/2022 1348   CL 101 05/16/2022 1348   CO2 31 05/16/2022 1348   GLUCOSE 132 (H) 05/16/2022 1348   BUN 20 05/16/2022 1348   CREATININE 0.84 05/16/2022 1348   CALCIUM 9.2 05/16/2022 1348   PROT 6.9 05/16/2022 1348   ALBUMIN 4.5 05/16/2022 1348   AST 20 05/16/2022 1348   ALT 16 05/16/2022 1348   ALKPHOS 44 05/16/2022 1348   BILITOT 0.3 05/16/2022 1348   GFRNONAA >60 05/16/2022 1348       Latest Ref Rng & Units 05/16/2022    1:48 PM 05/03/2021    9:56 AM  CBC EXTENDED  WBC 4.0 - 10.5 K/uL 8.9  4.9   RBC 3.87 - 5.11 MIL/uL 4.43  4.27   Hemoglobin 12.0 - 15.0 g/dL 16.1  09.6   HCT 04.5 - 46.0 % 40.6  38.7   Platelets 150 - 400 K/uL 223  229   NEUT# 1.7 - 7.7 K/uL 7.7  3.1   Lymph# 0.7 - 4.0 K/uL 0.9  1.4       ASSESSMENT AND PLAN:  58 year old female with history of Sjogren's syndrome GERD with known mild reflux esophagitis, with several months of persistent symptoms despite her usual behavioral and dietary modifications.  I do think that her persistent symptoms of nausea and hoarseness are most likely related to GERD.  We discussed how stress has been known to exacerbate GERD symptoms. Other she would prefer to avoid acid reducing medications, I think that the benefits of a trial of PPI would outweigh any risks for her. She has Nexium at home, and will plan to take Nexium daily for the next several weeks. I also recommended she consider taking Gaviscon at night and  elevating the head of bed but other ways to mitigate her nocturnal reflux symptoms. I do not think that repeat upper endoscopy is indicated, as she has had 2 EGDs in the past 5 years which both showed mild reflux esophagitis  and a small hiatal hernia.  Should her symptoms not improve with acid suppression therapy, then perhaps another upper endoscopy is reasonable.  For her episodic crampy abdominal pain, she recalls having clinical improvement with Bentyl in the past, I did offer IBgard as a more natural alternative, and she would prefer this.  I did caution her that peppermint oil has the potential to worsen her GERD symptoms.  If she takes the IBgard and notices worsening of her GERD symptoms, I would recommend she take Bentyl instead.  GERD - Nexium daily for 2-4 weeks  - Gaviscon nightly - Head of bed elevation - Continue dietary modifications  Abdominal pain - IBGard as needed (samples given)  Loraine Freid E. Tomasa Rand, MD [facility]  I spent a total of 35 minutes reviewing the patient's medical record, interviewing and examining the patient, discussing her diagnosis and management of her condition going forward, and documenting in the medical record   Creola Corn, MD

## 2023-05-09 DIAGNOSIS — M25511 Pain in right shoulder: Secondary | ICD-10-CM | POA: Diagnosis not present

## 2023-05-14 DIAGNOSIS — M3500 Sicca syndrome, unspecified: Secondary | ICD-10-CM | POA: Diagnosis not present

## 2023-05-14 DIAGNOSIS — N055 Unspecified nephritic syndrome with diffuse mesangiocapillary glomerulonephritis: Secondary | ICD-10-CM | POA: Diagnosis not present

## 2023-05-14 DIAGNOSIS — R110 Nausea: Secondary | ICD-10-CM | POA: Diagnosis not present

## 2023-05-14 DIAGNOSIS — M6289 Other specified disorders of muscle: Secondary | ICD-10-CM | POA: Diagnosis not present

## 2023-05-14 DIAGNOSIS — M25511 Pain in right shoulder: Secondary | ICD-10-CM | POA: Diagnosis not present

## 2023-05-15 DIAGNOSIS — M25511 Pain in right shoulder: Secondary | ICD-10-CM | POA: Diagnosis not present

## 2023-05-17 DIAGNOSIS — M25511 Pain in right shoulder: Secondary | ICD-10-CM | POA: Diagnosis not present

## 2023-05-25 ENCOUNTER — Other Ambulatory Visit: Payer: Self-pay

## 2023-05-25 DIAGNOSIS — C8580 Other specified types of non-Hodgkin lymphoma, unspecified site: Secondary | ICD-10-CM

## 2023-05-28 ENCOUNTER — Inpatient Hospital Stay: Payer: BC Managed Care – PPO | Attending: Hematology | Admitting: Hematology

## 2023-05-28 ENCOUNTER — Inpatient Hospital Stay: Payer: BC Managed Care – PPO

## 2023-05-28 VITALS — BP 122/62 | HR 69 | Temp 97.7°F | Resp 16 | Ht 64.0 in | Wt 125.6 lb

## 2023-05-28 DIAGNOSIS — C8580 Other specified types of non-Hodgkin lymphoma, unspecified site: Secondary | ICD-10-CM | POA: Diagnosis not present

## 2023-05-28 DIAGNOSIS — M3500 Sicca syndrome, unspecified: Secondary | ICD-10-CM | POA: Diagnosis not present

## 2023-05-28 DIAGNOSIS — Z880 Allergy status to penicillin: Secondary | ICD-10-CM | POA: Insufficient documentation

## 2023-05-28 DIAGNOSIS — Z8249 Family history of ischemic heart disease and other diseases of the circulatory system: Secondary | ICD-10-CM | POA: Diagnosis not present

## 2023-05-28 DIAGNOSIS — Z8616 Personal history of COVID-19: Secondary | ICD-10-CM | POA: Diagnosis not present

## 2023-05-28 DIAGNOSIS — Z885 Allergy status to narcotic agent status: Secondary | ICD-10-CM | POA: Insufficient documentation

## 2023-05-28 DIAGNOSIS — R590 Localized enlarged lymph nodes: Secondary | ICD-10-CM | POA: Diagnosis not present

## 2023-05-28 DIAGNOSIS — Z79899 Other long term (current) drug therapy: Secondary | ICD-10-CM | POA: Diagnosis not present

## 2023-05-28 DIAGNOSIS — N049 Nephrotic syndrome with unspecified morphologic changes: Secondary | ICD-10-CM | POA: Diagnosis not present

## 2023-05-28 DIAGNOSIS — Z881 Allergy status to other antibiotic agents status: Secondary | ICD-10-CM | POA: Insufficient documentation

## 2023-05-28 DIAGNOSIS — C884 Extranodal marginal zone b-cell lymphoma of mucosa-associated lymphoid tissue (malt-lymphoma) not having achieved remission: Secondary | ICD-10-CM | POA: Insufficient documentation

## 2023-05-28 DIAGNOSIS — Z8719 Personal history of other diseases of the digestive system: Secondary | ICD-10-CM | POA: Diagnosis not present

## 2023-05-28 DIAGNOSIS — Z833 Family history of diabetes mellitus: Secondary | ICD-10-CM | POA: Insufficient documentation

## 2023-05-28 DIAGNOSIS — R519 Headache, unspecified: Secondary | ICD-10-CM | POA: Insufficient documentation

## 2023-05-28 LAB — CBC WITH DIFFERENTIAL (CANCER CENTER ONLY)
Abs Immature Granulocytes: 0.01 10*3/uL (ref 0.00–0.07)
Basophils Absolute: 0 10*3/uL (ref 0.0–0.1)
Basophils Relative: 0 %
Eosinophils Absolute: 0.1 10*3/uL (ref 0.0–0.5)
Eosinophils Relative: 1 %
HCT: 39 % (ref 36.0–46.0)
Hemoglobin: 12.9 g/dL (ref 12.0–15.0)
Immature Granulocytes: 0 %
Lymphocytes Relative: 19 %
Lymphs Abs: 1.9 10*3/uL (ref 0.7–4.0)
MCH: 30.1 pg (ref 26.0–34.0)
MCHC: 33.1 g/dL (ref 30.0–36.0)
MCV: 90.9 fL (ref 80.0–100.0)
Monocytes Absolute: 0.5 10*3/uL (ref 0.1–1.0)
Monocytes Relative: 6 %
Neutro Abs: 7 10*3/uL (ref 1.7–7.7)
Neutrophils Relative %: 74 %
Platelet Count: 219 10*3/uL (ref 150–400)
RBC: 4.29 MIL/uL (ref 3.87–5.11)
RDW: 12.1 % (ref 11.5–15.5)
WBC Count: 9.6 10*3/uL (ref 4.0–10.5)
nRBC: 0 % (ref 0.0–0.2)

## 2023-05-28 LAB — CMP (CANCER CENTER ONLY)
ALT: 13 U/L (ref 0–44)
AST: 19 U/L (ref 15–41)
Albumin: 4.2 g/dL (ref 3.5–5.0)
Alkaline Phosphatase: 52 U/L (ref 38–126)
Anion gap: 4 — ABNORMAL LOW (ref 5–15)
BUN: 23 mg/dL — ABNORMAL HIGH (ref 6–20)
CO2: 30 mmol/L (ref 22–32)
Calcium: 9 mg/dL (ref 8.9–10.3)
Chloride: 103 mmol/L (ref 98–111)
Creatinine: 0.83 mg/dL (ref 0.44–1.00)
GFR, Estimated: >60 mL/min (ref >60)
Glucose, Bld: 100 mg/dL — ABNORMAL HIGH (ref 70–99)
Potassium: 4.3 mmol/L (ref 3.5–5.1)
Sodium: 137 mmol/L (ref 135–145)
Total Bilirubin: 0.3 mg/dL (ref 0.0–1.2)
Total Protein: 6.2 g/dL — ABNORMAL LOW (ref 6.5–8.1)

## 2023-05-28 LAB — LACTATE DEHYDROGENASE: LDH: 188 U/L (ref 98–192)

## 2023-05-28 NOTE — Progress Notes (Signed)
 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 Patient has a history of Sjogren's syndrome diagnosed per lip biopsy about 14 years ago.  She has had chronic sicca syndrome.  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  This has been treated with Rituxan and she continues to be on maintenance Rituxan every 6 months as per her nephrologist.  INTERVAL HISTORY  Mindy Fuller is a 58 y/o female pt presenting with right facial nerve pain.   Patient was last seen by me on 05/16/2022 and she complained of neck stiffness, headaches, and on small bony lesion near her neck.   Patient notes sh has been doing well overall since our last visit. She complains of new enlarged lymph nodes near her neck area. She went to her PCP and had her CT neck scan, which did not show any abnormalities.   Patient regularly follows-up with her Rheumatologist for her Sjorgen's syndrome. She notes that her treatment has changed from Rituxan to Plaquenil. Her last Rituxan treatment was in July.   Patient notes that she recently went to her ENT physician. She had a small lump under her tongue when she visited her physician. However, the small lump was not seen on the CT scan.   She denies any new infection issues, fever, chills, night sweats, unexpected weight loss, back pain, chest  pain, or leg swelling.   MEDICAL HISTORY:  Past Medical History:  Diagnosis Date   Acute glomerulonephritis    s/p COVID 2019   Anxiety    Blood transfusion without reported diagnosis    Constipation    COVID 2019   Extranodal marginal zone B-cell lymphoma    Gastritis    GERD (gastroesophageal reflux disease)    Low libido    Sjogren's syndrome (HCC)    Tension    Vitamin D deficiency    Vocal cord nodules   Follows with Dr. Latanya Presser for her glomerulonephritis   SURGICAL HISTORY Right superficial parotidectomy for extranodal marginal zone lymphoma Kidney biopsy 03/2019 History of breast biopsy benign cyst aspirated   SOCIAL HISTORY: Social History   Socioeconomic History   Marital status: Married    Spouse name: Not on file   Number of children: 2   Years of education: Not on file   Highest education level: Not on file  Occupational History   Occupation: Self Employed  Tobacco Use   Smoking status: Never    Passive exposure: Never   Smokeless tobacco: Never  Vaping Use   Vaping status: Never Used  Substance and Sexual Activity   Alcohol use: Yes    Comment: once a month   Drug use: Never   Sexual activity: Not on file  Other Topics Concern   Not on file  Social History Narrative   Not on file   Social Drivers of Health   Financial Resource Strain: Low Risk  (04/06/2019)  Received from [facility], [facility]   Overall Financial Resource Strain (CARDIA)    Difficulty of Paying Living Expenses: Not hard at all  Food Insecurity: Low Risk  (10/24/2022)   Received from [facility]   Hunger Vital Sign    Worried About Running Out of Food in the Last Year: Never true    Ran Out of Food in the Last Year: Never true  Transportation Needs: No Transportation Needs (04/06/2019)   Received from [facility], [facility]   [employer] - Administrator, Civil Service (Medical): No    Lack of Transportation (Non-Medical): No   Physical Activity: Inactive (04/11/2019)   Received from [facility], [facility]   Exercise Vital Sign    Days of Exercise per Week: 0 days    Minutes of Exercise per Session: 0 min  Stress: Stress Concern Present (04/11/2019)   Received from [facility], [facility]   [employer] of Occupational Health - Occupational Stress Questionnaire    Feeling of Stress : Rather much  Social Connections: Unknown (08/02/2021)   [facility] from [facility], [facility]   Social Network    Social Network: Not on file  Intimate Partner Violence: Unknown (06/24/2021)   Received from [facility], [facility]   HITS    Physically Hurt: Not on file    Insult or Talk Down To: Not on file    Threaten Physical Harm: Not on file    Scream or Curse: Not on file  Non-smoker nondrinker. 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   FAMILY HISTORY: Family History  Problem Relation Age of Onset   Hypertension Mother    Other Mother        pre-diabetes   Allergic rhinitis Father    Coronary artery disease Father    Diabetes Mellitus I Father    Atrial fibrillation Father    Colon cancer Neg Hx    Esophageal cancer Neg Hx    Stomach cancer Neg Hx    Rectal cancer Neg Hx     ALLERGIES:  is allergic to clarithromycin, amoxicillin, ciprofloxacin, and hydrocodone-acetaminophen.  MEDICATIONS:  Current Outpatient Medications  Medication Sig Dispense Refill   acetaminophen (TYLENOL) 500 MG tablet Take 1 tablet by mouth as needed.     ALPRAZolam (XANAX) 0.25 MG tablet Take 0.25 mg by mouth as needed.     baclofen (LIORESAL) 10 MG tablet 10 mg.     COLLAGEN MATRIX, BOVINE, EX daily.     hydroxychloroquine (PLAQUENIL) 200 MG tablet Take by mouth daily.     OVER THE COUNTER MEDICATION Pt taking Testosterone QDT     progesterone 200 MG SUPP Place 200 mg vaginally at bedtime.     riTUXimab (RITUXAN) 100 MG/10ML injection See admin instructions. Every 6  months (Patient not taking: Reported on 05/08/2023)     traZODone (DESYREL) 100 MG tablet Take 1 tablet by mouth at bedtime.     tretinoin (RETIN-A) 0.1 % cream APPLY DIME SIZED AMOUNT TO ENTIRE FACE NIGHTLY AS TOLERATED     triamcinolone (NASACORT) 55 MCG/ACT AERO nasal inhaler Place 2 sprays into the nose daily.     Current Facility-Administered Medications  Medication Dose Route Frequency Provider Last Rate Last Admin   methylPREDNISolone sodium succinate (SOLU-MEDROL) 125 mg/2 mL injection 125 mg  125 mg Intramuscular Once PRN Causey, Larna Daughters, NP        REVIEW OF SYSTEMS:    No fevers no chills no  night sweats no obvious new lumps or bumps. No new skin rashes.  PHYSICAL EXAMINATION: ECOG PERFORMANCE STATUS: 1 - Symptomatic but completely ambulatory  . Vitals:   05/28/23 1241  BP: 122/62  Pulse: 69  Resp: 16  Temp: 97.7 F (36.5 C)  SpO2: 99%    Filed Weights   05/28/23 1241  Weight: 125 lb 9.6 oz (57 kg)    .Body mass index is 21.56 kg/m.  NAD GENERAL:alert, in no acute distress and comfortable SKIN: no acute rashes, no significant lesions EYES: conjunctiva are pink and non-injected, sclera anicteric OROPHARYNX: MMM, no exudates, no oropharyngeal erythema or ulceration NECK: supple, no JVD LYMPH:  no palpable lymphadenopathy in the cervical, axillary or inguinal regions LUNGS: clear to auscultation b/l with normal respiratory effort HEART: regular rate & rhythm ABDOMEN:  normoactive bowel sounds , non tender, not distended.  No palpable hepatosplenomegaly Extremity: no pedal edema PSYCH: alert & oriented x 3 with fluent speech NEURO: no focal motor/sensory deficits   LABORATORY DATA:  I have reviewed the data as listed  .    Latest Ref Rng & Units 05/28/2023   12:25 PM 05/16/2022    1:48 PM 05/03/2021    9:56 AM  CBC  WBC 4.0 - 10.5 K/uL 9.6  8.9  4.9   Hemoglobin 12.0 - 15.0 g/dL 16.1  09.6  04.5   Hematocrit 36.0 - 46.0 % 39.0  40.6  38.7    Platelets 150 - 400 K/uL 219  223  229     .    Latest Ref Rng & Units 05/28/2023   12:25 PM 05/16/2022    1:48 PM 05/03/2021    9:56 AM  CMP  Glucose 70 - 99 mg/dL 409  811  914   BUN 6 - 20 mg/dL 23  20  18    Creatinine 0.44 - 1.00 mg/dL 7.82  9.56  2.13   Sodium 135 - 145 mmol/L 137  137  139   Potassium 3.5 - 5.1 mmol/L 4.3  4.3  4.0   Chloride 98 - 111 mmol/L 103  101  105   CO2 22 - 32 mmol/L 30  31  30    Calcium 8.9 - 10.3 mg/dL 9.0  9.2  9.4   Total Protein 6.5 - 8.1 g/dL 6.2  6.9  6.6   Total Bilirubin 0.0 - 1.2 mg/dL 0.3  0.3  0.4   Alkaline Phos 38 - 126 U/L 52  44  57   AST 15 - 41 U/L 19  20  26    ALT 0 - 44 U/L 13  16  16       Lab Results  Component Value Date   LDH 188 05/28/2023    RADIOGRAPHIC STUDIES: I have personally reviewed the radiological images as listed and agreed with the findings in the report. No results found.  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  Likely related to her Sjogren's disease  PLAN: -Discussed lab results from today, 05/28/2023, in detail with the patient. CBC stable. CMP stable.  -Discussed CT Soft tissue neck results from 04/18/2023. Did not show any abnormalities.  - no lab or clinical evidence of MZL progression at this time. -Recommended to continue to follow-up with Rheumatologist.   -Patient is currently on Plaquenil to manage her Sjogren's syndrome which is likely also helping  with her MZL.. -Patient will follow-up with Rheumatologist and will see [place] as needed.    FOLLOW-UP: RTC with Dr Candise Che as needed  The total time spent in the appointment was 20 minutes* .  All of the patient's questions were answered with apparent satisfaction. The patient knows to call the clinic with any problems, questions or concerns.   Wyvonnia Lora MD MS  AAHIVMS [facility] Hematology/Oncology Physician [facility]  .*Total Encounter Time as defined by the Centers for Medicare and Medicaid Services includes, in addition to the face-to-face time of a patient visit (documented in the note above) non-face-to-face time: obtaining and reviewing outside history, ordering and reviewing medications, tests or procedures, care coordination (communications with other health care professionals or caregivers) and documentation in the medical record.   I,Param Shah,acting as a Neurosurgeon for Wyvonnia Lora, MD.,have documented all relevant documentation on the behalf of Wyvonnia Lora, MD,as directed by  Wyvonnia Lora, MD while in the presence of Wyvonnia Lora, MD.  .I have reviewed the above documentation for accuracy and completeness, and I agree with the above. Johney Maine MD

## 2023-06-20 DIAGNOSIS — J019 Acute sinusitis, unspecified: Secondary | ICD-10-CM | POA: Diagnosis not present

## 2023-06-20 DIAGNOSIS — R0981 Nasal congestion: Secondary | ICD-10-CM | POA: Diagnosis not present

## 2023-06-20 DIAGNOSIS — R059 Cough, unspecified: Secondary | ICD-10-CM | POA: Diagnosis not present

## 2023-06-20 DIAGNOSIS — B9689 Other specified bacterial agents as the cause of diseases classified elsewhere: Secondary | ICD-10-CM | POA: Diagnosis not present

## 2023-06-21 DIAGNOSIS — E559 Vitamin D deficiency, unspecified: Secondary | ICD-10-CM | POA: Diagnosis not present

## 2023-06-21 DIAGNOSIS — E7800 Pure hypercholesterolemia, unspecified: Secondary | ICD-10-CM | POA: Diagnosis not present

## 2023-06-26 DIAGNOSIS — R82998 Other abnormal findings in urine: Secondary | ICD-10-CM | POA: Diagnosis not present

## 2023-06-26 DIAGNOSIS — M350C Sjogren syndrome with dental involvement: Secondary | ICD-10-CM | POA: Diagnosis not present

## 2023-06-26 DIAGNOSIS — Z0000 Encounter for general adult medical examination without abnormal findings: Secondary | ICD-10-CM | POA: Diagnosis not present

## 2023-07-09 ENCOUNTER — Telehealth: Payer: Self-pay | Admitting: Gastroenterology

## 2023-07-09 NOTE — Telephone Encounter (Signed)
 Pt states she has been taking her nexium but is still having nausea and stomach discomfort. She has been taking zofran and gaviscon but still having stomach tenderness and burning. She wonders if she has gastritis. Please advise.

## 2023-07-09 NOTE — Telephone Encounter (Signed)
 PT is calling to discuss her symptoms. She is still having abdominal pain. She's losing weight, nauseous and feels extreme pain when pushing her belly. She has tried Nexium and it just isn't helping her at all. Please advise.

## 2023-07-11 NOTE — Telephone Encounter (Signed)
 Pt notified that Dr Cherryl Corona would like to do an EGD. Please schedule pt for previsit and EGD.

## 2023-07-12 ENCOUNTER — Encounter: Payer: Self-pay | Admitting: Gastroenterology

## 2023-07-12 NOTE — Telephone Encounter (Signed)
 PV 5/12  1230; EGD 5/30 230pm

## 2023-07-13 DIAGNOSIS — L03221 Cellulitis of neck: Secondary | ICD-10-CM | POA: Diagnosis not present

## 2023-07-13 DIAGNOSIS — W57XXXA Bitten or stung by nonvenomous insect and other nonvenomous arthropods, initial encounter: Secondary | ICD-10-CM | POA: Diagnosis not present

## 2023-07-13 DIAGNOSIS — S1096XA Insect bite of unspecified part of neck, initial encounter: Secondary | ICD-10-CM | POA: Diagnosis not present

## 2023-07-19 DIAGNOSIS — N951 Menopausal and female climacteric states: Secondary | ICD-10-CM | POA: Diagnosis not present

## 2023-07-19 DIAGNOSIS — Z131 Encounter for screening for diabetes mellitus: Secondary | ICD-10-CM | POA: Diagnosis not present

## 2023-07-19 DIAGNOSIS — Z13220 Encounter for screening for lipoid disorders: Secondary | ICD-10-CM | POA: Diagnosis not present

## 2023-07-19 DIAGNOSIS — E559 Vitamin D deficiency, unspecified: Secondary | ICD-10-CM | POA: Diagnosis not present

## 2023-07-19 DIAGNOSIS — D519 Vitamin B12 deficiency anemia, unspecified: Secondary | ICD-10-CM | POA: Diagnosis not present

## 2023-07-19 DIAGNOSIS — E039 Hypothyroidism, unspecified: Secondary | ICD-10-CM | POA: Diagnosis not present

## 2023-07-19 DIAGNOSIS — E611 Iron deficiency: Secondary | ICD-10-CM | POA: Diagnosis not present

## 2023-07-19 DIAGNOSIS — R519 Headache, unspecified: Secondary | ICD-10-CM | POA: Diagnosis not present

## 2023-07-26 ENCOUNTER — Other Ambulatory Visit: Payer: Self-pay | Admitting: Internal Medicine

## 2023-07-26 DIAGNOSIS — M350C Sjogren syndrome with dental involvement: Secondary | ICD-10-CM | POA: Diagnosis not present

## 2023-07-26 DIAGNOSIS — R634 Abnormal weight loss: Secondary | ICD-10-CM

## 2023-07-26 DIAGNOSIS — R109 Unspecified abdominal pain: Secondary | ICD-10-CM

## 2023-07-30 ENCOUNTER — Ambulatory Visit (AMBULATORY_SURGERY_CENTER)

## 2023-07-30 VITALS — Ht 64.5 in | Wt 115.0 lb

## 2023-07-30 DIAGNOSIS — K2100 Gastro-esophageal reflux disease with esophagitis, without bleeding: Secondary | ICD-10-CM

## 2023-07-30 NOTE — Progress Notes (Signed)
 No egg or soy allergy  known to patient  No issues known to pt with past sedation with any surgeries or procedures Patient denies ever being told they had issues or difficulty with intubation  No FH of Malignant Hyperthermia Pt is not on diet pills Pt is not on  home 02  Pt is not on blood thinners  Pt has issues with constipation increases fiber to resolve it No A fib or A flutter Have any cardiac testing pending--no Pt can ambulate independently Pt denies use of chewing tobacco Discussed diabetic I weight loss medication holds Discussed NSAID holds Checked BMI Pt instructed to use Singlecare.com or GoodRx for a price reduction on prep  Patient's chart reviewed by Rogena Class CNRA prior to previsit and patient appropriate for the [facility].  Pre visit completed and red dot placed by patient's name on their procedure day (on provider's schedule).

## 2023-08-10 DIAGNOSIS — E039 Hypothyroidism, unspecified: Secondary | ICD-10-CM | POA: Diagnosis not present

## 2023-08-10 DIAGNOSIS — G4700 Insomnia, unspecified: Secondary | ICD-10-CM | POA: Diagnosis not present

## 2023-08-10 DIAGNOSIS — N951 Menopausal and female climacteric states: Secondary | ICD-10-CM | POA: Diagnosis not present

## 2023-08-10 DIAGNOSIS — R519 Headache, unspecified: Secondary | ICD-10-CM | POA: Diagnosis not present

## 2023-08-14 ENCOUNTER — Other Ambulatory Visit

## 2023-08-17 ENCOUNTER — Encounter: Admitting: Gastroenterology

## 2023-08-21 DIAGNOSIS — M545 Low back pain, unspecified: Secondary | ICD-10-CM | POA: Diagnosis not present

## 2023-08-30 ENCOUNTER — Encounter: Payer: Self-pay | Admitting: Gastroenterology

## 2023-09-11 ENCOUNTER — Encounter: Admitting: Gastroenterology

## 2023-10-05 DIAGNOSIS — N059 Unspecified nephritic syndrome with unspecified morphologic changes: Secondary | ICD-10-CM | POA: Diagnosis not present

## 2023-10-05 DIAGNOSIS — R768 Other specified abnormal immunological findings in serum: Secondary | ICD-10-CM | POA: Diagnosis not present

## 2023-10-05 DIAGNOSIS — H209 Unspecified iridocyclitis: Secondary | ICD-10-CM | POA: Diagnosis not present

## 2023-10-05 DIAGNOSIS — M3501 Sicca syndrome with keratoconjunctivitis: Secondary | ICD-10-CM | POA: Diagnosis not present

## 2023-10-05 DIAGNOSIS — Z79899 Other long term (current) drug therapy: Secondary | ICD-10-CM | POA: Diagnosis not present

## 2023-10-22 DIAGNOSIS — M3501 Sicca syndrome with keratoconjunctivitis: Secondary | ICD-10-CM | POA: Diagnosis not present

## 2023-10-22 DIAGNOSIS — Z79899 Other long term (current) drug therapy: Secondary | ICD-10-CM | POA: Diagnosis not present

## 2023-11-13 DIAGNOSIS — Z130 Encounter for screening for diseases of the blood and blood-forming organs and certain disorders involving the immune mechanism: Secondary | ICD-10-CM | POA: Diagnosis not present

## 2023-11-13 DIAGNOSIS — R87610 Atypical squamous cells of undetermined significance on cytologic smear of cervix (ASC-US): Secondary | ICD-10-CM | POA: Diagnosis not present

## 2023-11-13 DIAGNOSIS — Z01419 Encounter for gynecological examination (general) (routine) without abnormal findings: Secondary | ICD-10-CM | POA: Diagnosis not present

## 2023-11-13 DIAGNOSIS — Z6820 Body mass index (BMI) 20.0-20.9, adult: Secondary | ICD-10-CM | POA: Diagnosis not present

## 2023-12-20 DIAGNOSIS — E039 Hypothyroidism, unspecified: Secondary | ICD-10-CM | POA: Diagnosis not present

## 2023-12-20 DIAGNOSIS — R519 Headache, unspecified: Secondary | ICD-10-CM | POA: Diagnosis not present

## 2023-12-20 DIAGNOSIS — G4700 Insomnia, unspecified: Secondary | ICD-10-CM | POA: Diagnosis not present

## 2023-12-20 DIAGNOSIS — N951 Menopausal and female climacteric states: Secondary | ICD-10-CM | POA: Diagnosis not present

## 2024-01-24 DIAGNOSIS — R0981 Nasal congestion: Secondary | ICD-10-CM | POA: Diagnosis not present

## 2024-01-24 DIAGNOSIS — J019 Acute sinusitis, unspecified: Secondary | ICD-10-CM | POA: Diagnosis not present

## 2024-01-25 DIAGNOSIS — R0981 Nasal congestion: Secondary | ICD-10-CM | POA: Diagnosis not present

## 2024-01-25 DIAGNOSIS — M3500 Sicca syndrome, unspecified: Secondary | ICD-10-CM | POA: Diagnosis not present

## 2024-01-25 DIAGNOSIS — N055 Unspecified nephritic syndrome with diffuse mesangiocapillary glomerulonephritis: Secondary | ICD-10-CM | POA: Diagnosis not present

## 2024-02-11 DIAGNOSIS — J342 Deviated nasal septum: Secondary | ICD-10-CM | POA: Diagnosis not present

## 2024-02-11 DIAGNOSIS — K219 Gastro-esophageal reflux disease without esophagitis: Secondary | ICD-10-CM | POA: Diagnosis not present

## 2024-02-11 DIAGNOSIS — R0981 Nasal congestion: Secondary | ICD-10-CM | POA: Diagnosis not present

## 2024-02-11 DIAGNOSIS — J343 Hypertrophy of nasal turbinates: Secondary | ICD-10-CM | POA: Diagnosis not present

## 2024-02-11 DIAGNOSIS — J3489 Other specified disorders of nose and nasal sinuses: Secondary | ICD-10-CM | POA: Diagnosis not present

## 2024-02-11 DIAGNOSIS — R490 Dysphonia: Secondary | ICD-10-CM | POA: Diagnosis not present

## 2024-02-13 NOTE — Progress Notes (Signed)
 ENT/HEAD AND NECK SURGERY CLINIC NOTE   History of Present Illness The patient is a 58 year old female who presents for follow-up of hoarseness and possible developing sinusitis.  She reports recurrent episodes of hoarseness, with the most recent episode occurring 3 weeks ago. This is her sixth episode, and she is seeking advice on prevention strategies. She experiences a sensation of dryness in her nose, which she attributes to her Sjogren's syndrome. She has been using a saline spray, administering it 3 times in each nostril 3 to 4 times daily, which she finds beneficial in reducing postnasal drainage and the need for throat clearing. She does not use a humidifier or any allergy  medications, antihistamines, or nasal sprays. Her primary care physician recommended taking half an allergy  pill every other day due to her dry condition. She was previously using Nasacort but discontinued it due to its ineffectiveness. She also reports difficulty breathing through her nose when lying down, necessitating mouth breathing.   She had a tooth extracted 2 to 3 weeks ago, suspecting it might be the cause of her symptoms, but this did not alleviate her condition. She occasionally experiences heartburn and has a 26-year history of gastritis. She only takes PPIs when absolutely necessary, preferring to use Tums instead. She expresses a dislike for long-term medication use. She also reports frequent nosebleeds during the winter season.   Physical Exam Nose: Swelling observed, more pronounced on the left side. Clear drainage present. No signs of purulent infection. Small spur on the septum contributing to narrow nasal cavity. Minimal deviation of the septum noted.  Positive Cottle maneuver noted bilaterally Throat: Mild reflux-related swelling observed. Vocal cords appear healthy. No significant drainage. Others: Swelling observed in the turbinates.   Laryngoscopy  Date/Time: 02/11/2024 10:15 AM  Performed by:  Gerard Jenkins Shope, DO Authorized by: Gerard Jenkins Shope, DO  Local anesthesia used: yes  Anesthesia: Local anesthesia used: yes Local Anesthetic: topical anesthetic Comments: Flexible Laryngoscopy Procedure Note  Indications: Hoarseness   Risks, benefits and clinical relevance of the study were discussed. The patient understands and agrees to proceed.   Procedure: After adequate topical anesthetic was applied, 4 mm flexible laryngoscope was passed through the nasal cavity without difficulty. Flexible laryngoscopy shows patent anterior nasal cavity with minimal crusting, no discharge or infection.  Moderate interarytenoid edema and erythema, otherwise normal base of tongue and supraglottis Normal vocal cord mobility without vocal cord nodule, mass, polyp or tumor. Hypopharynx normal without mass, pooling of secretions or aspiration.  Patient tolerated procedure without complication or difficulty.     Nasal/Sinus Endoscopy  Date/Time: 02/11/2024 10:15 AM  Performed by: Gerard Jenkins Shope, DO Authorized by: Gerard Jenkins Shope, DO  Local anesthesia used: yes  Anesthesia: Local anesthesia used: yes Local Anesthetic: topical anesthetic  Sedation: Patient sedated: no  Comments: Procedure Note - Nasal Endoscopy   Risks/benefits and possible complications of this procedure were discussed in detail and the patient understood and agreed to proceed.  The nose was sprayed with oxymetazoline and 4% lidocaine . With the patient in the upright position, the flexibl endscope was inserted into the nasal passage bilaterally.  Where visible, nasal secretions and mucosal crusting were removed with suction. The overall appearance of the nasal cavity and paranasal sinuses were noted and the findings are described below.  Findings: no evidence of mucopurulent drainage, moderate allergic edema noted, small spur noted along the septum contributing to left nasal passage narrowing   The  patient tolerated the procedure without difficulty.  Assessment & Plan 1. Hoarseness. Hoarseness is likely due to reflux-related inflammation and swelling at the level of the larynx. Avoid throat clearing and instead swallow when experiencing the urge to clear the throat. Dietary modifications, including reducing caffeine, tomato-based foods, acidic, and spicy foods, are recommended. Adequate hydration throughout the day is also advised. . Home exercises will be provided to help manage symptoms. Enlist family members' help to remind not to clear the throat.  2.  Nasal congestion 3.  Nasal septal deviation 4.  Nasal valve obstruction 5.  Bilateral inferior turbinate hypertrophy  Nasal symptoms do not currently warrant antibiotic therapy. Nasal mucosa appears slightly dry even with saline use. The nasal valve area appears to be the primary source of symptoms due to some collapse there. The septum is minimally deviated with a spur contributing to obstruction more so on the left side. There is some swelling in the turbinates likely due to allergies and narrowing in the nasal valve. The turbinates engorge when lying flat at night, increasing blood flow to them and causing them to enlarge. This results in suboptimal airflow during the day due to the initial narrowing, which becomes completely obstructed when lying flat and the turbinates engorge.  Comprehensive discussion was had regarding both conservative and interventional options for management, to include use of Breathe Right strips versus an office Vivaer procedure, versus definitive management with septoplasty, repair of bilateral nasal valve collapse and inferior turbinate reduction.  Patient was provided information regarding each option.  I recommended trial of daily use of Sensimist nasal spray in conjunction with topical ointment as first-line treatment for nasal congestive symptoms.  Patient was counseled that if after a month of daily  use, there is no improvement noted, she is welcome to contact me to discuss interventional options again.   Electronically signed by:  Gerard DELENA Shope, DO  Staff Physician Otolaryngology - Head and Neck Surgery [facility] [place]

## 2024-02-21 DIAGNOSIS — L819 Disorder of pigmentation, unspecified: Secondary | ICD-10-CM | POA: Diagnosis not present

## 2024-02-21 DIAGNOSIS — L821 Other seborrheic keratosis: Secondary | ICD-10-CM | POA: Diagnosis not present

## 2024-02-21 DIAGNOSIS — L820 Inflamed seborrheic keratosis: Secondary | ICD-10-CM | POA: Diagnosis not present

## 2024-02-21 DIAGNOSIS — L814 Other melanin hyperpigmentation: Secondary | ICD-10-CM | POA: Diagnosis not present

## 2024-02-21 DIAGNOSIS — L738 Other specified follicular disorders: Secondary | ICD-10-CM | POA: Diagnosis not present

## 2024-02-25 DIAGNOSIS — N6311 Unspecified lump in the right breast, upper outer quadrant: Secondary | ICD-10-CM | POA: Diagnosis not present

## 2024-02-25 DIAGNOSIS — R928 Other abnormal and inconclusive findings on diagnostic imaging of breast: Secondary | ICD-10-CM | POA: Diagnosis not present

## 2024-03-12 ENCOUNTER — Other Ambulatory Visit (HOSPITAL_COMMUNITY): Payer: Self-pay

## 2024-03-12 ENCOUNTER — Ambulatory Visit (INDEPENDENT_AMBULATORY_CARE_PROVIDER_SITE_OTHER): Payer: Self-pay

## 2024-03-12 DIAGNOSIS — L988 Other specified disorders of the skin and subcutaneous tissue: Secondary | ICD-10-CM

## 2024-03-12 DIAGNOSIS — R238 Other skin changes: Secondary | ICD-10-CM

## 2024-03-12 MED ORDER — LIDOCAINE 23% - TETRACAINE 7% TOPICAL OINTMENT (PLASTICIZED)
1.0000 | TOPICAL_OINTMENT | Freq: Once | CUTANEOUS | 0 refills | Status: AC
  Filled 2024-03-12: qty 60, 30d supply, fill #0
  Filled 2024-04-09: qty 60, 1d supply, fill #0

## 2024-03-12 NOTE — Progress Notes (Signed)
 "  New Patient Office Visit  Subjective    Patient ID: Mindy Fuller, female    DOB: 1965/03/30  Age: 58 y.o. 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  She does skin care with Retin-A.  Does not have a formal aesthetician.  Has done BBL for rosacea before, stated that she needed a few treatments to see results.  Has never done Halo.  She has also underwent microneedling without PRP in the past and has noticed that the results do not last as long as she would like to.  She comes today to discuss BBL and halo laser treatments.  She is also interested in the treatment of temporal hollowing as well as fat grafting to the face.  Of note, patient enjoys to play tennis under the sun.  Outpatient Encounter Medications as of 03/12/2024  Medication Sig   acetaminophen (TYLENOL) 500 MG tablet Take 1 tablet by mouth as needed.   ALPRAZolam (XANAX) 0.25 MG tablet Take 0.25 mg by mouth as needed.   baclofen (LIORESAL) 10 MG tablet 10 mg.   COLLAGEN MATRIX, BOVINE, EX daily.   dicyclomine (BENTYL) 10 MG capsule Take 10 mg by mouth 3 (three) times daily as needed.   estradiol  (VIVELLE -DOT) 0.025 MG/24HR Place 1 patch onto the skin [name] times a week.   folic acid -vitamin b complex-vitamin c-selenium-zinc  (DIALYVITE) 3 MG TABS tablet Take 1 tablet by mouth daily. (Patient not taking: Reported on 07/30/2023)   hydroxychloroquine  (PLAQUENIL ) 200 MG tablet Take by mouth daily.   Naltrexone (VIVITROL) 380 MG SUSR Inject into the muscle. (Patient not taking: Reported on 07/30/2023)   ondansetron (ZOFRAN-ODT) 4 MG disintegrating tablet Take 4 mg by mouth [name] times daily as needed.   OVER THE COUNTER MEDICATION Pt taking Testosterone  QDT   Prasterone, DHEA, 10 MG CAPS  Take by mouth.   Pregnenolone Micronized (PREGNENOLONE PO) Take 75 mg by mouth.   progesterone  200 MG SUPP Place 200 mg vaginally at bedtime.   traZODone (DESYREL) 100 MG tablet Take 1 tablet by mouth at bedtime.   tretinoin (RETIN-A) 0.1 % cream APPLY DIME SIZED AMOUNT TO ENTIRE FACE NIGHTLY AS TOLERATED   triamcinolone  (NASACORT) 55 MCG/ACT AERO nasal inhaler Place Fuller sprays into the nose daily.   Vitamin D-Vitamin K (D3 + K2 PO) Take by mouth.   Facility-Administered Encounter Medications as of 03/12/2024  Medication   methylPREDNISolone  sodium succinate (SOLU-MEDROL ) 125 mg/Fuller mL injection 125 mg    Past Medical History:  Diagnosis Date   Acute glomerulonephritis    s/p COVID 2019   Allergy     Anxiety    Blood transfusion without reported diagnosis    Constipation    COVID 2019   Extranodal marginal zone B-cell lymphoma    Gastritis    GERD (gastroesophageal reflux disease)    Low libido    Sjogren's syndrome    Tension    Vitamin D deficiency    Vocal cord nodules     Past Surgical History:  Procedure Laterality Date   BREAST CYST ASPIRATION     LUMBAR FUSION     L5-S1   ORIF METATARSAL FRACTURE Left    PAROTIDECTOMY Right    RENAL BIOPSY     TONSILLECTOMY      Family History  Problem  Relation Age of Onset   Hypertension Mother    Other Mother        pre-diabetes   Allergic rhinitis Father    Coronary artery disease Father    Diabetes Mellitus I Father    Atrial fibrillation Father    Colon cancer Neg Hx    Esophageal cancer Neg Hx    Stomach cancer Neg Hx    Rectal cancer Neg Hx    Colon polyps Neg Hx     Social History   Socioeconomic History   Marital status: Married    Spouse name: Not on file   Number of children: Fuller   Years of education: Not on file   Highest education level: Not on file  Occupational History   Occupation: Self Employed  Tobacco Use   Smoking status: Never    Passive exposure: Never   Smokeless tobacco: Never  Vaping  Use   Vaping status: Never Used  Substance and Sexual Activity   Alcohol use: Yes    Comment: once a month   Drug use: Never   Sexual activity: Not on file  Other Topics Concern   Not on file  Social History Narrative   Not on file   Social Drivers of Health   Tobacco Use: Low Risk (02/11/2024)   Received from [facility]   Patient History    Smoking Tobacco Use: Never    Smokeless Tobacco Use: Never    Passive Exposure: Not on file  Financial Resource Strain: Not on file  Food Insecurity: Low Risk (10/24/2022)   Received from [facility]   Epic    Within the past 12 months, the food you bought just didn't last and you didn't have money to get more. : Never true    Within the past 12 months, you worried that your food would run out before you got money to buy more: Never true  Transportation Needs: Not on file  Physical Activity: Not on file  Stress: Not on file  Social Connections: Not on file  Intimate Partner Violence: Not on file  Depression (EYV7-0): Not on file  Alcohol Screen: Not on file  Housing: Low Risk (10/24/2022)   Received from [facility]   Epic    Think about the place you live. Do you have problems with any of the following? Choose all that apply:: None/None on this list    What is your living situation today?: I have a steady place to live  Utilities: Not on file  Health Literacy: Not on file    ROS ROS negative except as noted in HPI     Objective    There were no vitals taken for this visit.  Physical Exam MA as chaperone Throughout hollowing noticed bilaterally. Skin quality is good for her age.  Fitzpatrick Fuller.  Rhytids noticed throughout her face more severe around her mouth and around her eyes.  Rosacea mainly noticed around the cheeks and nose.  Mild to moderate generalized sun damage with sun spots. Midface: Overall midfacial descent with midfacial volume atrophy. Prominent nasolabial folds, marionette lines, submental skin laxity and  redundancy, upper and lower lips that lack volume.   Neck: skin laxity at midline that extends down to the upper third of her neck, no prominent digastric muscles or submandibular glands, crevicomental angle is blunted but not significantly. She does not have significant adiposity of her neck, mostly just skin redundancy.  Assessment & Plan:   Problem List Items Addressed This Visit  None   No follow-ups on file.   Laila Myhre M Marcelle Bebout, MD  We discussed fat grafting to the face and temporal area. We are never sure how much fat will remain and other risks relate to donor site infection, irregularity and pain. Irregularity at the site of injection and chronic infection is a remote possibility.  We discussed the significant anti aging benefit of volume replacement.     We discussed perioral rejuvenation including deep resurfacing lasers, halo laser and BBL laser. The usual postoperative course including the 'rawness' of the skin and the need to keep it covered in ointment was stressed. Risks include scarring, pigment change and recalcitrant rhytids.    We also discussed ZO skin care and that this forms the basis of most aesthetic plans for facial rejuvenation. I encourage most patients to consider this as it is non-surgical and makes a big difference.  Patient will see Maryelizabeth for first consultation.    We did discuss surgery and anesthesia in general if patient were to undergo fat grafting to the face and the risks related such as DVT/PE, infection, bleeding, organ injury, and other cardiovascular events. These are unexpected but the risk thereof can never be 0%.     The patient has a good understanding of all the risks and benefits, postoperative course and care. We obtained pictures and will give the patient quotes. All questions were answered.     The plan agreed upon includes: Halo and BBL lasers to face and neck.  Patient instructed to stop the retinal 4 weeks before the laser.  Lidocaine   cream ordered.  Consultation with Maryelizabeth for skin care. Quote for fat grafting to the face and temporal areas.   I spent 30 minutes counseling the patient and discussing plan of care.  Phila Shoaf M Magalene Mclear    "

## 2024-03-24 ENCOUNTER — Other Ambulatory Visit (HOSPITAL_COMMUNITY): Payer: Self-pay

## 2024-03-28 ENCOUNTER — Telehealth: Payer: Self-pay

## 2024-03-28 NOTE — Telephone Encounter (Signed)
 Pt just called back, she said she was waiting on someone to call her back after speaking with Dr CHRISTELLA about spiting up her Halo and BBL, pt didn't know if she should do both at the same time. A few days ago we left a vmail that at the time we had 04-18-24 open to move up. I have not spoken to the pt since leaving her a vmail about moving up. 04-18-24 is full already to move a HALO, pt wants to be seen sooner and is not happy that she can't move up. I do not know who she spoke with about splitting a Halo and BBL. There is no new documentation of a new telephone call that she stated she made. I can't move her up unless Dr CHRISTELLA wants to open sooner time for me. Please advise.  She mentioned a wait list which she is on. I explained to her that a wait works by if an apt that cancels that meets her time frame she will get notified but she is not the only one. I believe they send it out to about 10 people on the list and its a first come bases, if some one takes the time first epic will move their apt. Pt said she did not know that, but she definitely  knows that now.

## 2024-04-09 ENCOUNTER — Other Ambulatory Visit (HOSPITAL_COMMUNITY): Payer: Self-pay

## 2024-04-10 ENCOUNTER — Ambulatory Visit: Payer: Self-pay

## 2024-04-10 DIAGNOSIS — R238 Other skin changes: Secondary | ICD-10-CM

## 2024-04-10 DIAGNOSIS — L988 Other specified disorders of the skin and subcutaneous tissue: Secondary | ICD-10-CM

## 2024-04-10 DIAGNOSIS — L719 Rosacea, unspecified: Secondary | ICD-10-CM

## 2024-04-10 NOTE — Progress Notes (Signed)
 Preoperative Dx: Facial aging and Rosacea  Postoperative Dx:  same  Procedure: BBL Laser to Face and HALO laser to face and neck  Anesthesia: none  Description of Procedure:   Risks and complications of BBL were explained to the patient. Consent was confirmed and signed. Eye protection was placed. Time out was called and all information was confirmed to be correct. The area  area was prepped with alcohol and wiped dry. The BBL laser was set at 7 J/cm2. The areas that were more red were treated with 15 J/cm2. The face was lasered for a total of 2 passes.. The patient tolerated the procedure well and there were no complications. Then we proceeded with the HALO laser. Risks and complications were explained to the patient. Consent was confirmed and signed. Eye protection was placed. Time out was called and all information was confirmed to be correct. The area  area was prepped with alcohol and wiped dry. The HALO laser was set at 300 J/cm2. The face and neck were lasered. The patient tolerated the procedure well and there were no complications. The patient is to follow up in 2 weeks.  Chela Sutphen M. Tyquarius Paglia, MD [facility]

## 2024-04-25 ENCOUNTER — Other Ambulatory Visit

## 2024-04-25 ENCOUNTER — Ambulatory Visit

## 2024-04-25 DIAGNOSIS — L719 Rosacea, unspecified: Secondary | ICD-10-CM

## 2024-04-25 DIAGNOSIS — R238 Other skin changes: Secondary | ICD-10-CM

## 2024-04-25 DIAGNOSIS — L988 Other specified disorders of the skin and subcutaneous tissue: Secondary | ICD-10-CM

## 2024-04-25 NOTE — Progress Notes (Signed)
" ° °  Established Patient Office Visit  Subjective   Patient ID: Mindy Fuller, female    DOB: November 21, 1965  Age: 59 y.o. MRN: [id number]  Chief Complaint  Patient presents with   Follow-up    HPI  39 F who presents s/p BBL Laser to Face and HALO laser to face and neck  Patient is doing well after lasers, no complaints.    Objective:     There were no vitals taken for this visit. BP Readings from Last 3 Encounters:  05/28/23 122/62  05/08/23 98/72  10/05/22 118/68    Physical Exam MA as chaperone Face Skin with some redness on cheeks. Otherwise healing as expected.   No results found for any visits on 04/25/24.  The ASCVD Risk score (Arnett DK, et al., 2019) failed to calculate for the following reasons:   Cannot find a previous HDL lab   Cannot find a previous total cholesterol lab   * - Cholesterol units were assumed    Assessment & Plan:   Problem List Items Addressed This Visit   None Visit Diagnoses       Facial aging    -  Primary     Rosacea         Age-related facial wrinkles           Doing well after laser treatment. No complications.  Patient instructed to call if any questions or concerns. Patient doing well after laser treatment. BBL in 2 weeks. Botox injections in 4 weeks.    Mathea Frieling M Brody Kump, MD  "

## 2024-05-07 ENCOUNTER — Other Ambulatory Visit

## 2024-05-21 ENCOUNTER — Encounter
